# Patient Record
Sex: Female | Born: 1950 | Race: Black or African American | Hispanic: No | Marital: Married | Smoking: Never smoker
Health system: Southern US, Community
[De-identification: ages and names within clinical notes are randomized; demographics above are authoritative.]

## PROBLEM LIST (undated history)

## (undated) DIAGNOSIS — M1990 Unspecified osteoarthritis, unspecified site: Secondary | ICD-10-CM

## (undated) DIAGNOSIS — M549 Dorsalgia, unspecified: Secondary | ICD-10-CM

## (undated) DIAGNOSIS — I10 Essential (primary) hypertension: Secondary | ICD-10-CM

## (undated) DIAGNOSIS — I8390 Asymptomatic varicose veins of unspecified lower extremity: Secondary | ICD-10-CM

## (undated) HISTORY — DX: Asymptomatic varicose veins of unspecified lower extremity: I83.90

## (undated) HISTORY — DX: Unspecified osteoarthritis, unspecified site: M19.90

## (undated) HISTORY — DX: Essential (primary) hypertension: I10

---

## 2012-08-30 HISTORY — PX: BREAST REDUCTION SURGERY: SHX8

## 2012-10-21 DIAGNOSIS — M25549 Pain in joints of unspecified hand: Secondary | ICD-10-CM | POA: Insufficient documentation

## 2013-12-19 DIAGNOSIS — R232 Flushing: Secondary | ICD-10-CM | POA: Insufficient documentation

## 2013-12-19 DIAGNOSIS — M545 Low back pain, unspecified: Secondary | ICD-10-CM | POA: Insufficient documentation

## 2013-12-19 DIAGNOSIS — I10 Essential (primary) hypertension: Secondary | ICD-10-CM | POA: Insufficient documentation

## 2014-03-05 DIAGNOSIS — I8393 Asymptomatic varicose veins of bilateral lower extremities: Secondary | ICD-10-CM | POA: Insufficient documentation

## 2014-03-20 ENCOUNTER — Encounter: Payer: Self-pay | Admitting: Surgery

## 2014-03-20 ENCOUNTER — Other Ambulatory Visit: Payer: Self-pay

## 2014-03-20 DIAGNOSIS — I83813 Varicose veins of bilateral lower extremities with pain: Secondary | ICD-10-CM

## 2014-04-26 ENCOUNTER — Encounter: Payer: Self-pay | Admitting: Surgery

## 2014-04-29 ENCOUNTER — Ambulatory Visit (HOSPITAL_COMMUNITY)
Admission: RE | Admit: 2014-04-29 | Discharge: 2014-04-29 | Disposition: A | Discharge status: Home / Self Care | Payer: BC Managed Care – PPO | Source: Ambulatory Visit | Attending: Surgery | Admitting: Surgery

## 2014-04-29 ENCOUNTER — Ambulatory Visit (INDEPENDENT_AMBULATORY_CARE_PROVIDER_SITE_OTHER): Payer: BC Managed Care – PPO | Admitting: Surgery

## 2014-04-29 ENCOUNTER — Encounter: Payer: Self-pay | Admitting: Surgery

## 2014-04-29 VITALS — BP 127/74 | HR 52 | Ht 65.0 in | Wt 234.0 lb

## 2014-04-29 DIAGNOSIS — I83813 Varicose veins of bilateral lower extremities with pain: Secondary | ICD-10-CM

## 2014-04-29 DIAGNOSIS — I872 Venous insufficiency (chronic) (peripheral): Secondary | ICD-10-CM

## 2014-04-29 NOTE — Progress Notes (Signed)
Patient name: Stephanie Stephenson MRN: [id number] DOB: 12/29/1950 Sex: female   Referred by: Dr Riley NearingAguiar  Reason for referral:  Chief Complaint  Patient presents with  . Varicose Veins    bilateral vv's w/ pain X 5 years    HISTORY OF PRESENT ILLNESS: This is a very pleasant 63 year old female who comes in today for leg pain.  She states that she is been having aching in her legs for a long time but has gotten worse.  Her right leg bothers her more so than the left.  She gets minimal relief from leg elevation.  But benefits to her the most is laying down for a while.  She does endorse swelling in both legs particularly at the end of the day.  She has not had any bleeding episodes.  She is concerned about several areas on her right leg where the veins are protruding.  She denies a history of DVT.  The patient is medically managed for hypertension.  She does take Neurontin for some back pain.  Past Medical History  Diagnosis Date  . Hypertension     Past Surgical History  Procedure Laterality Date  . Breast reduction surgery  08/30/2012    History   Social History  . Marital Status: Married    Spouse Name: N/A    Number of Children: N/A  . Years of Education: N/A   Occupational History  . Not on file.   Social History Main Topics  . Smoking status: Never Smoker   . Smokeless tobacco: Never Used  . Alcohol Use: No  . Drug Use: No  . Sexual Activity: Not on file   Other Topics Concern  . Not on file   Social History Narrative    Family History  Problem Relation Age of Onset  . Hypertension Mother   . Varicose Veins Mother   . Hypertension Father     Allergies as of 04/29/2014  . (Not on File)    Current Outpatient Prescriptions on File Prior to Visit  Medication Sig Dispense Refill  . amLODipine (NORVASC) 5 MG tablet Take 5 mg by mouth daily.    Marland Kitchen. gabapentin (NEURONTIN) 300 MG capsule Take 300 mg by mouth 3 (three) times daily.    .  valsartan-hydrochlorothiazide (DIOVAN-HCT) 320-12.5 MG per tablet Take 1 tablet by mouth daily.     No current facility-administered medications on file prior to visit.     REVIEW OF SYSTEMS: Please see history of present illness, otherwise all systems negative  PHYSICAL EXAMINATION: General: The patient appears their stated age.  Vital signs are BP 127/74 mmHg  Pulse 52  Ht 5\' 5"  (1.651 m)  Wt 234 lb (106.142 kg)  BMI 38.94 kg/m2  SpO2 100% HEENT:  No gross abnormalities Pulmonary: Respirations are non-labored  Musculoskeletal: There are no major deformities.   Neurologic: No focal weakness or paresthesias are detected, Skin: There are no ulcer or rashes noted. Psychiatric: The patient has normal affect. Cardiovascular: There is a regular rate and rhythm without significant murmur appreciated.  Palpable dorsalis pedis pulse bilaterally.  Cluster of reticular veins in the right thigh.  Positive edema bilaterally  Diagnostic Studies: Duplex venous ultrasound has been ordered and reviewed.  The patient has deep vein reflux bilaterally.  She has reflux in the right great saphenous vein with maximum vein diameter of 0.79 cm at the saphenofemoral junction.  She also has reflux and a branch of the left saphenous vein as well as the  proximal left  great saphenous vein    Assessment:  Bilateral venous insufficiency Plan: I discussed with the patient that she has both deep and superficial venous insufficiency bilaterally which is likely the etiology of her leg complaints of aching and swelling.  I discussed with the first step in management is going to be for her to wear 20-30 mmhg thigh-high compression stockings to see how much benefit she gets from this.  She would potentially be a candidate for laser ablation of the right great saphenous vein as well as treatment of the protruding venous anomaly in her right thigh.  I have her scheduled to come back to see Dr. early in 3 months to discuss  her treatment options.     Jorge NyV. Wells Miho Monda IV, M.D. [facility] Office: [phone] Pager:  [phone]

## 2014-07-20 ENCOUNTER — Emergency Department (HOSPITAL_BASED_OUTPATIENT_CLINIC_OR_DEPARTMENT_OTHER)
Admission: EM | Admit: 2014-07-20 | Discharge: 2014-07-20 | Disposition: A | Discharge status: Home / Self Care | Payer: BC Managed Care – PPO | Attending: Emergency Medicine | Admitting: Emergency Medicine

## 2014-07-20 ENCOUNTER — Emergency Department (HOSPITAL_BASED_OUTPATIENT_CLINIC_OR_DEPARTMENT_OTHER): Payer: BC Managed Care – PPO

## 2014-07-20 ENCOUNTER — Encounter (HOSPITAL_BASED_OUTPATIENT_CLINIC_OR_DEPARTMENT_OTHER): Payer: Self-pay

## 2014-07-20 DIAGNOSIS — I10 Essential (primary) hypertension: Secondary | ICD-10-CM | POA: Insufficient documentation

## 2014-07-20 DIAGNOSIS — M25559 Pain in unspecified hip: Secondary | ICD-10-CM

## 2014-07-20 DIAGNOSIS — Z79899 Other long term (current) drug therapy: Secondary | ICD-10-CM | POA: Insufficient documentation

## 2014-07-20 DIAGNOSIS — Y9389 Activity, other specified: Secondary | ICD-10-CM | POA: Diagnosis not present

## 2014-07-20 DIAGNOSIS — S79912A Unspecified injury of left hip, initial encounter: Secondary | ICD-10-CM | POA: Diagnosis present

## 2014-07-20 DIAGNOSIS — X58XXXA Exposure to other specified factors, initial encounter: Secondary | ICD-10-CM | POA: Insufficient documentation

## 2014-07-20 DIAGNOSIS — Y998 Other external cause status: Secondary | ICD-10-CM | POA: Diagnosis not present

## 2014-07-20 DIAGNOSIS — Y9289 Other specified places as the place of occurrence of the external cause: Secondary | ICD-10-CM | POA: Insufficient documentation

## 2014-07-20 HISTORY — DX: Dorsalgia, unspecified: M54.9

## 2014-07-20 MED ORDER — NAPROXEN 500 MG PO TABS
500.0000 mg | ORAL_TABLET | Freq: Two times a day (BID) | ORAL | Status: DC
Start: 2014-07-20 — End: 2022-05-01

## 2014-07-20 NOTE — ED Provider Notes (Signed)
CSN: [id number]     Arrival date & time 07/20/14  1521 History  This chart was scribed for No att. providers found by [facility] Abu Hashem, ED Scribe. The patient was seen in MHT13/MHT13 and the patient's care was started at 5:14 PM.  Chief Complaint  Patient presents with  . Hip Pain   The history is provided by the patient. No language interpreter was used.    HPI Comments: Carroll SageBrenda Stephenson is a 64 y.o. female who presents to the Emergency Department complaining of a new left sided hip pain, acute onset 4 days ago. Pt was at the gym doing back kicks when she suddenly hear a pop in her left hip. Pt states certain movements and sitting positions causes pain. She must drag her foot when walking because "stepping wrong" aggravates her pain. Directly sitting on her left side worsens the pain. Pt states she rubbed the area this morning and noticed the area was very firm. She has taken ibuprofen for no relief. She had intermittent numbness but this has a subsided. She denies  back pain, and urine/bowel incontinence.   Past Medical History  Diagnosis Date  . Hypertension   . Back pain    Past Surgical History  Procedure Laterality Date  . Breast reduction surgery  08/30/2012   Family History  Problem Relation Age of Onset  . Hypertension Mother   . Varicose Veins Mother   . Hypertension Father    History  Substance Use Topics  . Smoking status: Never Smoker   . Smokeless tobacco: Never Used  . Alcohol Use: No   OB History    No data available     Review of Systems  Constitutional: Negative for fever.  Gastrointestinal: Negative for nausea and vomiting.  Musculoskeletal: Positive for myalgias and arthralgias. Negative for back pain, joint swelling and neck pain.  Skin: Negative for wound.  Neurological: Negative for weakness, numbness and headaches.    Allergies  Review of patient's allergies indicates no known allergies.  Home Medications   Prior to Admission medications    Medication Sig Start Date End Date Taking? Authorizing Provider  amLODipine (NORVASC) 5 MG tablet Take 5 mg by mouth daily.    Historical Provider, MD  gabapentin (NEURONTIN) 300 MG capsule Take 300 mg by mouth 3 (three) times daily.    Historical Provider, MD  naproxen (NAPROSYN) 500 MG tablet Take 1 tablet (500 mg total) by mouth 2 (two) times daily. 07/20/14   Rolan BuccoMelanie Nguyen Butler, MD  valsartan-hydrochlorothiazide (DIOVAN-HCT) 320-12.5 MG per tablet Take 1 tablet by mouth daily.    Historical Provider, MD   BP 105/64 mmHg  Pulse 65  Temp(Src) 98.5 F (36.9 C) (Oral)  Resp 18  Ht 5\' 4"  (1.626 m)  Wt 233 lb (105.688 kg)  BMI 39.97 kg/m2  SpO2 97% Physical Exam  Constitutional: She is oriented to person, place, and time. She appears well-developed and well-nourished.  HENT:  Head: Normocephalic and atraumatic.  Neck: Normal range of motion. Neck supple.  Cardiovascular: Normal rate.   Pulmonary/Chest: Effort normal.  Musculoskeletal: She exhibits tenderness. She exhibits no edema.  Patient has tenderness to the posterior aspect of the hip at the area around the hamstring insertion point. There is no significant bony tenderness. There is no pain on range of motion the hip. There is no knee pain. She has normal sensation and motor function in the leg. Pedal pulses are intact. There is no spinal tenderness.  Neurological: She is alert and oriented  to person, place, and time.  Skin: Skin is warm and dry.  Psychiatric: She has a normal mood and affect.    ED Course  Procedures  DIAGNOSTIC STUDIES: Oxygen Saturation is 97% on room air, normal by my interpretation.    COORDINATION OF CARE: 5:19 PM Discussed treatment plan with pt at bedside and pt agreed to plan.  No results found for this or any previous visit. Dg Hip Unilat With Pelvis 2-3 Views Left  07/20/2014   CLINICAL DATA:  Left-sided hip pain, acute onset. Felt pop in left hip. Initial encounter.  EXAM: LEFT HIP (WITH PELVIS) 2-3  VIEWS  COMPARISON:  None.  FINDINGS: There is no evidence of fracture or dislocation. Both femoral heads are seated normally within their respective acetabula. The proximal left femur appears intact. No significant degenerative change is appreciated. The sacroiliac joints are unremarkable in appearance.  The visualized bowel gas pattern is grossly unremarkable in appearance. Scattered phleboliths are noted within the pelvis.  IMPRESSION: No evidence of fracture or dislocation. The patient's presentation raises suspicion for internal derangement at the left hip. MRI of the left hip would be helpful for further evaluation, when and as deemed clinically appropriate.   Electronically Signed   By: Roanna Raider M.D.   On: 07/20/2014 18:38     MDM   Final diagnoses:  Hip pain   Patient has pain mostly at the area of the hamstring insertion point.  This is likely the etiology of her pain. There is no evidence of bony injury on her x-ray. She was given a prescription for Naprosyn to use for pain and inflammation. She didn't want any stronger pain medication. She'll follow-up with Dr. Pearletha Forge for reexam if her symptoms are not improving within the next few days.  I personally performed the services described in this documentation, which was scribed in my presence.  The recorded information has been reviewed and considered.      Rolan Bucco, MD 07/21/14 0010

## 2014-07-20 NOTE — ED Notes (Signed)
Pt reports was exercising and doing a back kick with L leg when heard a "pop" in L hip.  Since this time with pain in same and intermittent numbness to L foot.  Denies loss of bowel/bladder.  Ambualtory wihtout difficulty.

## 2014-07-29 ENCOUNTER — Encounter: Payer: Self-pay | Admitting: Vascular Surgery

## 2014-07-30 ENCOUNTER — Encounter: Payer: Self-pay | Admitting: Vascular Surgery

## 2014-07-30 ENCOUNTER — Ambulatory Visit (INDEPENDENT_AMBULATORY_CARE_PROVIDER_SITE_OTHER): Payer: BC Managed Care – PPO | Admitting: Vascular Surgery

## 2014-07-30 VITALS — BP 120/80 | HR 56 | Resp 18 | Ht 63.0 in | Wt 235.2 lb

## 2014-07-30 DIAGNOSIS — I83893 Varicose veins of bilateral lower extremities with other complications: Secondary | ICD-10-CM | POA: Diagnosis not present

## 2014-07-30 DIAGNOSIS — I83899 Varicose veins of unspecified lower extremities with other complications: Secondary | ICD-10-CM | POA: Insufficient documentation

## 2014-07-30 NOTE — Progress Notes (Signed)
Problems with Activities of Daily Living Secondary to Leg Pain  1. [name] states that all activities that require prolonged standing (cooking, cleaning, shopping) are difficult for her due to leg pain.    2. [name] states that she has difficulty falling asleep due to leg pain.   3. [name] states that traveling in automobile(especialy on trips) is difficult for her due to leg pain.   Failure of  Conservative Therapy:  1. Worn 20-30 mm Hg thigh high compression hose >3 months with no relief of symptoms.  2. Frequently elevates legs-no relief of symptoms  3. Taken Ibuprofen 600 Mg TID with no relief of symptoms.  The patient presents today for continued follow-up of her venous hypertension. This is much worse on her right leg and her left leg. She has been extremely compliant with pain at the style graduated compression garments. She reports these actually made the leg discomfort worse at sometimes an extremely difficult for her to get these on and off. She is obese. 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 Had episode of bleeding from the superficial telangiectasia over her anterior medial calf in the past as well.  Worsened she's had no history of DVT. She reports achy sensation specifically over the varicosities and in her leg with prolonged standing.  Physical exam she does have palpable dorsalis pedis pulses bilaterally. She does have a very pronounced mass of telangiectasia and varicosities that are raised above the skin on the anterior thigh and anterior medial calf. I reimage these areas with SonoSite ultrasound. She does have an anterior accessory branch of her great saphenous vein that his feeding directly into this large plexus and also into the varicosities extend from this down across the anterior surface of her knee distally. She also has enlargement and reflux in her  great saphenous vein throughout its course with some communication into the plexus in her medial calf.  Impression and plan: Failed conservative treatment of severe venous hypertension related to anterior accessory branch and great saphenous vein reflux. I have recommended treatment. I have recommended ablation of her anterior accessory branch and stab phlebectomy of these large tributaries arising from it. Would also recommend sclerotherapy of these large superficial telangiectasia as well to reduce her risk for rebleed. There is a staged procedure I would recommend ablation of her right great saphenous vein. She understands this as a outpatient procedure under local anesthesia. We will coordinate this at her earliest convenience.

## 2014-08-08 ENCOUNTER — Other Ambulatory Visit: Payer: Self-pay | Admitting: *Deleted

## 2014-08-08 DIAGNOSIS — I83893 Varicose veins of bilateral lower extremities with other complications: Secondary | ICD-10-CM

## 2014-08-12 ENCOUNTER — Telehealth: Payer: Self-pay | Admitting: Vascular Surgery

## 2014-08-12 NOTE — Telephone Encounter (Signed)
-----   Message from Micah FlesherSonya D Rankin, RN sent at 08/08/2014  4:57 PM EDT ----- Regarding: scheduling Please schedule Stephanie Stephenson for:   1.  09-03-2014 Post LA duplex (right accessory branch of GSV, right leg, order in EPIC)  and VV FU with Dr. Arbie CookeyEarly.   2.  10-10-2014  Post LA duplex (right greater saphenous vein, order in EPIC) and VV FU with Dr. Arbie CookeyEarly.    Thanks!

## 2014-08-28 ENCOUNTER — Encounter: Payer: Self-pay | Admitting: Vascular Surgery

## 2014-08-29 ENCOUNTER — Ambulatory Visit (INDEPENDENT_AMBULATORY_CARE_PROVIDER_SITE_OTHER): Payer: BC Managed Care – PPO | Admitting: Vascular Surgery

## 2014-08-29 ENCOUNTER — Encounter: Payer: Self-pay | Admitting: Vascular Surgery

## 2014-08-29 VITALS — BP 153/93 | HR 54 | Resp 16 | Ht 63.0 in | Wt 235.0 lb

## 2014-08-29 DIAGNOSIS — I83893 Varicose veins of bilateral lower extremities with other complications: Secondary | ICD-10-CM | POA: Diagnosis not present

## 2014-08-29 HISTORY — PX: ENDOVENOUS ABLATION SAPHENOUS VEIN W/ LASER: SUR449

## 2014-08-29 NOTE — Progress Notes (Signed)
     Laser Ablation Procedure    Date: 08/29/2014   Carroll SageBrenda Cleckler DOB:09/03/1950  Consent signed: Yes    Surgeon:  Dr. Tawanna Coolerodd Irean Kendricks  Procedure: Laser Ablation: right ant accessory saphenous Vein  BP 153/93 mmHg  Pulse 54  Resp 16  Ht 5\' 3"  (1.6 m)  Wt 235 lb (106.595 kg)  BMI 41.64 kg/m2  Tumescent Anesthesia: 425 cc 0.9% NaCl with 50 cc Lidocaine HCL with 1% Epi and 15 cc 8.4% NaHCO3  Local Anesthesia: 6 cc Lidocaine HCL and NaHCO3 (ratio 2:1)  15 watts continuous mode        Total energy: 1324   Total time: 1:28    Stab Phlebectomy: 10-20 Sites: Thigh and Calf  Patient tolerated procedure well  Notes:   Description of Procedure:  After marking the course of the secondary varicosities, the patient was placed on the operating table in the supine position, and the right leg was prepped and draped in sterile fashion.   Local anesthetic was administered and under ultrasound guidance the saphenous vein was accessed with a micro needle and guide wire; then the mirco puncture sheath was placed.  A guide wire was inserted saphenofemoral junction , followed by a 5 french sheath.  The position of the sheath and then the laser fiber below the junction was confirmed using the ultrasound.  Tumescent anesthesia was administered along the course of the saphenous vein using ultrasound guidance. The patient was placed in Trendelenburg position and protective laser glasses were placed on patient and staff, and the laser was fired at 15 watts continuous mode advancing 1-612mm/second for a total of 1324 joules.   For stab phlebectomies, local anesthetic was administered at the previously marked varicosities, and tumescent anesthesia was administered around the vessels.  Ten to 20 stab wounds were made using the tip of an 11 blade. And using the vein hook, the phlebectomies were performed using a hemostat to avulse the varicosities.  Adequate hemostasis was achieved.     Steri strips were applied to  the stab wounds and ABD pads and thigh high compression stockings were applied.  Ace wrap bandages were applied over the phlebectomy sites and at the top of the saphenofemoral junction. Blood loss was less than 15 cc.  The patient ambulated out of the operating room having tolerated the procedure well.

## 2014-09-02 ENCOUNTER — Encounter: Payer: Self-pay | Admitting: Vascular Surgery

## 2014-09-03 ENCOUNTER — Ambulatory Visit (HOSPITAL_COMMUNITY)
Admission: RE | Admit: 2014-09-03 | Discharge: 2014-09-03 | Disposition: A | Discharge status: Home / Self Care | Payer: BC Managed Care – PPO | Source: Ambulatory Visit | Attending: Vascular Surgery | Admitting: Vascular Surgery

## 2014-09-03 ENCOUNTER — Encounter: Payer: Self-pay | Admitting: Vascular Surgery

## 2014-09-03 ENCOUNTER — Ambulatory Visit (INDEPENDENT_AMBULATORY_CARE_PROVIDER_SITE_OTHER): Payer: Self-pay | Admitting: Vascular Surgery

## 2014-09-03 ENCOUNTER — Other Ambulatory Visit: Payer: Self-pay | Admitting: Vascular Surgery

## 2014-09-03 VITALS — BP 136/84 | HR 86 | Resp 18 | Ht 63.0 in | Wt 235.0 lb

## 2014-09-03 DIAGNOSIS — I83891 Varicose veins of right lower extremities with other complications: Secondary | ICD-10-CM | POA: Diagnosis not present

## 2014-09-03 DIAGNOSIS — I83893 Varicose veins of bilateral lower extremities with other complications: Secondary | ICD-10-CM

## 2014-09-03 DIAGNOSIS — I83899 Varicose veins of unspecified lower extremities with other complications: Secondary | ICD-10-CM | POA: Insufficient documentation

## 2014-09-03 NOTE — Progress Notes (Signed)
Patient is here today for follow-up of laser ablation of right anterior accessory branch and stab phlebectomy of multiple large surgery varicosities on 08/29/2014. She had amount of bruising. She has done well otherwise and said the minimal discomfort associated with this.  Duplex today shows successful closure of her anterior accessory branch evidence of DVT.  Impression and plan successful treatment of her anterior accessory branch which was the main factor in feeding these very large varicosities which were removed with stab phlebectomy. She will be seen again on 10/03/2014 for ablation of great saphenous vein which is also enlarged and has reflux. She may require sclerotherapy of telangiectasia depending on resolution after treatment of her saphenous vein.

## 2014-09-05 ENCOUNTER — Ambulatory Visit: Payer: BC Managed Care – PPO | Admitting: Vascular Surgery

## 2014-09-05 ENCOUNTER — Encounter (HOSPITAL_COMMUNITY): Payer: BC Managed Care – PPO

## 2014-10-01 ENCOUNTER — Encounter: Payer: Self-pay | Admitting: Vascular Surgery

## 2014-10-03 ENCOUNTER — Ambulatory Visit (INDEPENDENT_AMBULATORY_CARE_PROVIDER_SITE_OTHER): Payer: BC Managed Care – PPO | Admitting: Vascular Surgery

## 2014-10-03 ENCOUNTER — Encounter: Payer: Self-pay | Admitting: Vascular Surgery

## 2014-10-03 VITALS — BP 125/81 | HR 59 | Resp 18 | Ht 65.0 in | Wt 234.0 lb

## 2014-10-03 DIAGNOSIS — I83891 Varicose veins of right lower extremities with other complications: Secondary | ICD-10-CM | POA: Diagnosis not present

## 2014-10-03 HISTORY — PX: ENDOVENOUS ABLATION SAPHENOUS VEIN W/ LASER: SUR449

## 2014-10-03 NOTE — Progress Notes (Signed)
     Laser Ablation Procedure    Date: 10/03/2014   Stephanie Stephenson DOB:08/10/1950  Consent signed: Yes    Surgeon:  Dr. Tawanna Coolerodd Jaysie Benthall  Procedure: Laser Ablation: right Greater Saphenous Vein  BP 125/81 mmHg  Pulse 59  Resp 18  Ht 5\' 5"  (1.651 m)  Wt 234 lb (106.142 kg)  BMI 38.94 kg/m2  Tumescent Anesthesia: 475 cc 0.9% NaCl with 50 cc Lidocaine HCL with 1% Epi and 15 cc 8.4% NaHCO3  Local Anesthesia: 3 cc Lidocaine HCL and NaHCO3 (ratio 2:1)  15 watts continuous mode        Total energy: 1790 Joules   Total time: 1:59      Patient tolerated procedure well    Description of Procedure:  After marking the course of the secondary varicosities, the patient was placed on the operating table in the supine position, and the right leg was prepped and draped in sterile fashion.   Local anesthetic was administered and under ultrasound guidance the saphenous vein was accessed with a micro needle and guide wire; then the mirco puncture sheath was placed.  A guide wire was inserted saphenofemoral junction , followed by a 5 french sheath.  The position of the sheath and then the laser fiber below the junction was confirmed using the ultrasound.  Tumescent anesthesia was administered along the course of the saphenous vein using ultrasound guidance. The patient was placed in Trendelenburg position and protective laser glasses were placed on patient and staff, and the laser was fired at 15 watts continuous mode advancing 1-82mm/second for a total of 1790 joules.        Steri strips were applied and ABD pads and thigh high compression stockings were applied.  Ace wrap bandages were applied  at the top of the saphenofemoral junction. Blood loss was less than 15 cc.  The patient ambulated out of the operating room having tolerated the procedure well.   uneventful laser ablation from just above the knee to just below the saphenofemoral junction.

## 2014-10-04 ENCOUNTER — Telehealth: Payer: Self-pay | Admitting: *Deleted

## 2014-10-04 NOTE — Telephone Encounter (Signed)
    10/04/2014  Time: 10:13 AM   Patient Name: Stephanie Stephenson  Patient of: T.F. Early  Procedure:Laser Ablation right greater saphenous vein 10-03-2014  Reached patient at home and checked  Her status  Yes    Comments/Actions Taken: [name] states that yesterday she removed her thigh high compression dressing because it was very tight and she replaced it with her pantyhose compression stockings.  Instructed her to place ace wrap on her right thigh up to the right groin.  Reviewed post procedural instructions with her and reminded her of post LA duplex and VV FU with Dr. Arbie CookeyEarly on 10-10-2014.       @SIGNATURE @

## 2014-10-09 ENCOUNTER — Encounter: Payer: Self-pay | Admitting: Vascular Surgery

## 2014-10-10 ENCOUNTER — Ambulatory Visit (INDEPENDENT_AMBULATORY_CARE_PROVIDER_SITE_OTHER): Payer: Self-pay | Admitting: Vascular Surgery

## 2014-10-10 ENCOUNTER — Encounter: Payer: Self-pay | Admitting: Vascular Surgery

## 2014-10-10 ENCOUNTER — Ambulatory Visit (HOSPITAL_COMMUNITY)
Admission: RE | Admit: 2014-10-10 | Discharge: 2014-10-10 | Disposition: A | Discharge status: Home / Self Care | Payer: BC Managed Care – PPO | Source: Ambulatory Visit | Attending: Vascular Surgery | Admitting: Vascular Surgery

## 2014-10-10 VITALS — BP 134/70 | HR 66 | Resp 16 | Ht 65.0 in | Wt 236.0 lb

## 2014-10-10 DIAGNOSIS — I83891 Varicose veins of right lower extremities with other complications: Secondary | ICD-10-CM

## 2014-10-10 DIAGNOSIS — I83893 Varicose veins of bilateral lower extremities with other complications: Secondary | ICD-10-CM | POA: Diagnosis not present

## 2014-10-10 NOTE — Progress Notes (Signed)
The patient presents today for follow-up of her ablation of right great saphenous vein she had ablation of anterior accessory branch and stab phlebectomy of large painful tributary varicosities several weeks earlier. She did quite well with the ablation last week minimal discomfort associated with this.  On physical exam she has minimal bruising. No tenderness and no skin irritation.  Duplex today reveals closure of her great saphenous vein from the distal insertion site of her knee to 1 cm below the saphenofemoral junction with no DVT  Impression and plan: Successful treatment of venous hypertension right leg area she will continue her compression garments one additional week and then as needed. Will see us again on as-needed basis

## 2014-10-28 ENCOUNTER — Encounter: Payer: Self-pay | Admitting: *Deleted

## 2014-10-30 ENCOUNTER — Ambulatory Visit (INDEPENDENT_AMBULATORY_CARE_PROVIDER_SITE_OTHER): Payer: BC Managed Care – PPO | Admitting: *Deleted

## 2014-10-30 DIAGNOSIS — I83899 Varicose veins of unspecified lower extremities with other complications: Secondary | ICD-10-CM | POA: Diagnosis not present

## 2014-10-30 DIAGNOSIS — I83891 Varicose veins of right lower extremities with other complications: Secondary | ICD-10-CM

## 2014-10-30 NOTE — Progress Notes (Signed)
X=.3% Sotradecol administered with a 27g butterfly.  Patient received a total of 18cc.  Treated all her spider veins that have a potential for hemorrhage. Easy access. Tol well. She has new varicose veins that have developed on her L thigh. Alerted Domenic Moras RN who will discuss with Dr. Arbie Cookey tomorrow to make a plan. She has continued to wear her 20-30 compression stockings since Dec. 2015   Photos: No.  Compression stockings applied: Yes.

## 2014-10-31 ENCOUNTER — Other Ambulatory Visit: Payer: Self-pay | Admitting: *Deleted

## 2014-10-31 ENCOUNTER — Telehealth: Payer: Self-pay | Admitting: Vascular Surgery

## 2014-10-31 DIAGNOSIS — I83812 Varicose veins of left lower extremities with pain: Secondary | ICD-10-CM

## 2014-10-31 NOTE — Telephone Encounter (Signed)
-----   Message from Micah Flesher, RN sent at 10/31/2014  1:16 PM EDT ----- Regarding: scheduling Per Dr. Bosie Helper instruction, please schedule (and call her ) Stephanie Stephenson for venous reflux study of left leg and VV FU with Dr. Arbie Cookey at her convenience.  Will put order in EPIC for reflux study.  She is experiencing large, painful VV in her left thigh.

## 2014-10-31 NOTE — Telephone Encounter (Signed)
Spoke with pt to schedule, dpm °

## 2014-11-01 ENCOUNTER — Encounter: Payer: Self-pay | Admitting: Vascular Surgery

## 2014-11-05 ENCOUNTER — Ambulatory Visit (INDEPENDENT_AMBULATORY_CARE_PROVIDER_SITE_OTHER): Payer: BC Managed Care – PPO | Admitting: Vascular Surgery

## 2014-11-05 ENCOUNTER — Ambulatory Visit (HOSPITAL_COMMUNITY)
Admission: RE | Admit: 2014-11-05 | Discharge: 2014-11-05 | Disposition: A | Discharge status: Home / Self Care | Payer: BC Managed Care – PPO | Source: Ambulatory Visit | Attending: Vascular Surgery | Admitting: Vascular Surgery

## 2014-11-05 ENCOUNTER — Encounter: Payer: Self-pay | Admitting: Vascular Surgery

## 2014-11-05 VITALS — BP 128/83 | HR 53 | Resp 18 | Ht 65.0 in | Wt 232.2 lb

## 2014-11-05 DIAGNOSIS — I83893 Varicose veins of bilateral lower extremities with other complications: Secondary | ICD-10-CM | POA: Diagnosis not present

## 2014-11-05 DIAGNOSIS — I83812 Varicose veins of left lower extremities with pain: Secondary | ICD-10-CM | POA: Diagnosis present

## 2014-11-05 NOTE — Progress Notes (Signed)
Problems with Activities of Daily Living Secondary to Leg Pain  1. Stephanie Stephenson states that she has difficulty doing yard work due to leg pain.   2. Stephanie Stephenson states she has stopped exercising due to leg pain.    3. Stephanie Stephenson states she has difficulty with any activities that require prolonged standing (cooking, cleaning, shopping) due to leg pain.   4.  Stephanie Stephenson has limited her driving (automobile) frequency due to leg pain.     Failure of  Conservative Therapy:  1. Worn 20-30 mm Hg thigh high compression hose >3 months with no relief of symptoms.  2. Frequently elevates legs-no relief of symptoms  3. Taken Ibuprofen 600 Mg TID with no relief of symptoms.  Here today for discussion of left leg venous hypertension. She is quite pleased with her result from treatment of her right leg. She had ablation of right saphenous vein and the phlebectomy of multiple large painful tributary varicosities. She has had progression of varicosities and pain in her left leg. These are in the medial thigh extending onto the anterior surface of her thigh. She reports these are quite uncomfortable with prolonged standing. She has been very diligent with Stephanie Stephenson compression and this is not helped.  On physical exam obese black female in no acute distress Her 2+ dorsalis pedis pulses bilaterally Right leg has very nice continued resolution after stab phlebectomy of large tributary varicosities on her anterior thigh. Left leg with large varices in her medial and anterior thigh  She did undergo formal venous duplex shows enlargement of her great saphenous vein throughout its course. It ranges from 8 mm at the knee to 1.1 cm at the saphenofemoral junction. There is reflux present. She does not have any evidence of deep venous reflux on the left  Impression and plan. Symptomatic venous hypertension left leg have recommended laser ablation of her left great saphenous vein and phlebectomy of her large  tributary varicosities arising from this. She wished to proceed as soon as possible

## 2014-11-07 ENCOUNTER — Other Ambulatory Visit: Payer: Self-pay | Admitting: *Deleted

## 2014-11-07 DIAGNOSIS — I83893 Varicose veins of bilateral lower extremities with other complications: Secondary | ICD-10-CM

## 2014-12-05 DIAGNOSIS — R202 Paresthesia of skin: Secondary | ICD-10-CM | POA: Insufficient documentation

## 2015-01-01 ENCOUNTER — Encounter: Payer: Self-pay | Admitting: Vascular Surgery

## 2015-01-02 ENCOUNTER — Encounter: Payer: Self-pay | Admitting: Vascular Surgery

## 2015-01-02 ENCOUNTER — Ambulatory Visit (INDEPENDENT_AMBULATORY_CARE_PROVIDER_SITE_OTHER): Payer: BC Managed Care – PPO | Admitting: Vascular Surgery

## 2015-01-02 DIAGNOSIS — I83892 Varicose veins of left lower extremities with other complications: Secondary | ICD-10-CM

## 2015-01-02 HISTORY — PX: ENDOVENOUS ABLATION SAPHENOUS VEIN W/ LASER: SUR449

## 2015-01-02 NOTE — Progress Notes (Signed)
     Laser Ablation Procedure    Date: 01/02/2015   Stephanie Stephenson DOB:May 30, 1950  Consent signed: Yes    Surgeon:  Dr. Tawanna Cooler Dhruvan Gullion  Procedure: Laser Ablation: left Greater Saphenous Vein  BP 134/86 mmHg  Pulse 84  Temp(Src) 97.1 F (36.2 C) (Oral)  Resp 18  Ht  (1.651 m)  Wt 236 lb (107.049 kg)  BMI 39.27 kg/m2  SpO2 99%  Tumescent Anesthesia: 500 cc 0.9% NaCl with 50 cc Lidocaine HCL with 1% Epi and 15 cc 8.4% NaHCO3  Local Anesthesia: 6 cc Lidocaine HCL and NaHCO3 (ratio 2:1)  15 watts continuous mode        Total energy: 2221 Joules   Total time: 2 :27    Stab Phlebectomy: 10-20 Sites: Thigh and Calf  (left leg)  Patient tolerated procedure well    Description of Procedure:  After marking the course of the secondary varicosities, the patient was placed on the operating table in the supine position, and the left leg was prepped and draped in sterile fashion.   Local anesthetic was administered and under ultrasound guidance the saphenous vein was accessed with a micro needle and guide wire; then the mirco puncture sheath was placed.  A guide wire was inserted saphenofemoral junction , followed by a 5 french sheath.  The position of the sheath and then the laser fiber below the junction was confirmed using the ultrasound.  Tumescent anesthesia was administered along the course of the saphenous vein using ultrasound guidance. The patient was placed in Trendelenburg position and protective laser glasses were placed on patient and staff, and the laser was fired at 15 watts continuous mode advancing 1-24mm/second for a total of 2221 joules.   For stab phlebectomies, local anesthetic was administered at the previously marked varicosities, and tumescent anesthesia was administered around the vessels.  Ten to 20 stab wounds were made using the tip of an 11 blade. And using the vein hook, the phlebectomies were performed using a hemostat to avulse the varicosities.  Adequate  hemostasis was achieved.     Steri strips were applied to the stab wounds and ABD pads and thigh high compression stockings were applied.  Ace wrap bandages were applied over the phlebectomy sites and at the top of the saphenofemoral junction. Blood loss was less than 15 cc.  The patient ambulated out of the operating room having tolerated the procedure well.  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

## 2015-01-03 ENCOUNTER — Telehealth: Payer: Self-pay | Admitting: *Deleted

## 2015-01-03 NOTE — Telephone Encounter (Signed)
    01/03/2015  Time: 11:07 AM   Patient Name: Stephanie Stephenson  Patient of: T.F. Early  Procedure:Laser Ablation left greater saphenous vein and stab phlebectomy 10-20 incisions left leg 01-02-2015  Reached patient at home and checked  Her status  Yes    Comments/Actions Taken: [name] states she is having difficulty with Ace Bandages rolling down her thigh and calf. States the pantyhose compression hose are staying in place and that she has placed thigh high compression hose over pantyhose style compression instead of Ace wraps.  Encouraged her to remove thigh high compression hose if she experienced pain or numbness in the left leg or foot.  She states she had "small amount of bleeding/oozing" noted on compression dressing yesterday around stab phlebectomy site but no bleeding today.  No c/o left leg pain or swelling.  Reviewed post procedural instructions with her and reminded her of post LA duplex and VV follow up with Dr. Arbie Cookey on 01-09-2015.         @

## 2015-01-08 ENCOUNTER — Encounter: Payer: Self-pay | Admitting: Vascular Surgery

## 2015-01-09 ENCOUNTER — Ambulatory Visit (INDEPENDENT_AMBULATORY_CARE_PROVIDER_SITE_OTHER): Payer: Self-pay | Admitting: Vascular Surgery

## 2015-01-09 ENCOUNTER — Ambulatory Visit (HOSPITAL_COMMUNITY)
Admission: RE | Admit: 2015-01-09 | Discharge: 2015-01-09 | Disposition: A | Discharge status: Home / Self Care | Payer: BC Managed Care – PPO | Source: Ambulatory Visit | Attending: Vascular Surgery | Admitting: Vascular Surgery

## 2015-01-09 ENCOUNTER — Encounter: Payer: Self-pay | Admitting: Vascular Surgery

## 2015-01-09 VITALS — BP 124/88 | HR 60 | Resp 16 | Ht 62.0 in | Wt 235.0 lb

## 2015-01-09 DIAGNOSIS — I83893 Varicose veins of bilateral lower extremities with other complications: Secondary | ICD-10-CM

## 2015-01-09 DIAGNOSIS — I10 Essential (primary) hypertension: Secondary | ICD-10-CM | POA: Insufficient documentation

## 2015-01-09 NOTE — Progress Notes (Signed)
Here today for follow-up of her left leg great saphenous vein ablation and stab phlebectomy 1 week ago. She had similar treatment to her right leg month ago. She has continued to quite well. She has been compliant with her compression garments. She reports minimal discomfort associated with this.  On physical exam she does have a mild to moderate bruising in her medial left thigh. No evidence of irritation from her phlebectomy sites.  She underwent duplex showing closure of her great saphenous vein from the distal insertion site below the saphenofemoral junction and no evidence of DVT  Impression and plan stable status post staged bilateral laser ablation. We'll continue her compression garment for one additional week. She is interested in potential treatment for telangiectasia and we'll schedule this with Carney Harder

## 2015-03-05 ENCOUNTER — Ambulatory Visit (INDEPENDENT_AMBULATORY_CARE_PROVIDER_SITE_OTHER): Payer: BC Managed Care – PPO | Admitting: *Deleted

## 2015-03-05 DIAGNOSIS — I83892 Varicose veins of left lower extremities with other complications: Secondary | ICD-10-CM

## 2015-03-05 DIAGNOSIS — I83893 Varicose veins of bilateral lower extremities with other complications: Secondary | ICD-10-CM

## 2015-03-05 NOTE — Progress Notes (Signed)
X=.3% Sotradecol administered with a 27g butterfly.  Patient received a total of 6cc.  Treated all I could with one syringe on the left leg. She will come back in Dec for work on the back of her left knee. Easy access. Tol well.  Photos: No.  Compression stockings applied: Yes.

## 2015-03-10 ENCOUNTER — Encounter: Payer: Self-pay | Admitting: Vascular Surgery

## 2015-03-11 DIAGNOSIS — J302 Other seasonal allergic rhinitis: Secondary | ICD-10-CM | POA: Insufficient documentation

## 2015-03-11 DIAGNOSIS — Z0000 Encounter for general adult medical examination without abnormal findings: Secondary | ICD-10-CM | POA: Insufficient documentation

## 2015-04-30 ENCOUNTER — Ambulatory Visit: Payer: BC Managed Care – PPO | Admitting: *Deleted

## 2015-05-02 ENCOUNTER — Encounter: Payer: Self-pay | Admitting: *Deleted

## 2015-05-07 ENCOUNTER — Ambulatory Visit (INDEPENDENT_AMBULATORY_CARE_PROVIDER_SITE_OTHER): Payer: BC Managed Care – PPO | Admitting: *Deleted

## 2015-05-07 DIAGNOSIS — I83893 Varicose veins of bilateral lower extremities with other complications: Secondary | ICD-10-CM

## 2015-05-07 NOTE — Progress Notes (Signed)
X=.3% Sotradecol administered with a 27g butterfly.  Patient received a total of 6cc.  Pt wanted me to treat specific sites on her left leg. Fairly easy access. Tol well. Anticipate improvement. Follow this nice lady prn.  Photos: No.  Compression stockings applied: Yes.

## 2015-05-08 ENCOUNTER — Encounter: Payer: Self-pay | Admitting: Vascular Surgery

## 2015-11-05 DIAGNOSIS — Z23 Encounter for immunization: Secondary | ICD-10-CM | POA: Insufficient documentation

## 2016-05-21 DIAGNOSIS — H539 Unspecified visual disturbance: Secondary | ICD-10-CM | POA: Insufficient documentation

## 2018-02-14 LAB — HM COLONOSCOPY

## 2018-04-01 ENCOUNTER — Emergency Department (HOSPITAL_BASED_OUTPATIENT_CLINIC_OR_DEPARTMENT_OTHER)
Admission: EM | Admit: 2018-04-01 | Discharge: 2018-04-01 | Disposition: A | Discharge status: Home / Self Care | Payer: Medicare Other | Attending: Emergency Medicine | Admitting: Emergency Medicine

## 2018-04-01 ENCOUNTER — Other Ambulatory Visit: Payer: Self-pay

## 2018-04-01 ENCOUNTER — Encounter (HOSPITAL_BASED_OUTPATIENT_CLINIC_OR_DEPARTMENT_OTHER): Payer: Self-pay | Admitting: Emergency Medicine

## 2018-04-01 DIAGNOSIS — M791 Myalgia, unspecified site: Secondary | ICD-10-CM | POA: Insufficient documentation

## 2018-04-01 DIAGNOSIS — M79604 Pain in right leg: Secondary | ICD-10-CM | POA: Diagnosis not present

## 2018-04-01 DIAGNOSIS — Z79899 Other long term (current) drug therapy: Secondary | ICD-10-CM | POA: Diagnosis not present

## 2018-04-01 DIAGNOSIS — M79605 Pain in left leg: Secondary | ICD-10-CM

## 2018-04-01 DIAGNOSIS — I10 Essential (primary) hypertension: Secondary | ICD-10-CM | POA: Insufficient documentation

## 2018-04-01 DIAGNOSIS — R1110 Vomiting, unspecified: Secondary | ICD-10-CM | POA: Insufficient documentation

## 2018-04-01 DIAGNOSIS — R509 Fever, unspecified: Secondary | ICD-10-CM | POA: Insufficient documentation

## 2018-04-01 LAB — URINALYSIS, ROUTINE W REFLEX MICROSCOPIC
Bilirubin Urine: NEGATIVE
Glucose, UA: NEGATIVE mg/dL
Ketones, ur: NEGATIVE mg/dL
Leukocytes, UA: NEGATIVE
Nitrite: NEGATIVE
Protein, ur: NEGATIVE mg/dL
Specific Gravity, Urine: 1.025 (ref 1.005–1.030)
pH: 6 (ref 5.0–8.0)

## 2018-04-01 LAB — COMPREHENSIVE METABOLIC PANEL
ALT: 12 U/L (ref 0–44)
AST: 19 U/L (ref 15–41)
Albumin: 3.6 g/dL (ref 3.5–5.0)
Alkaline Phosphatase: 52 U/L (ref 38–126)
Anion gap: 8 (ref 5–15)
BUN: 13 mg/dL (ref 8–23)
CHLORIDE: 107 mmol/L (ref 98–111)
CO2: 24 mmol/L (ref 22–32)
CREATININE: 0.95 mg/dL (ref 0.44–1.00)
Calcium: 9.1 mg/dL (ref 8.9–10.3)
GFR calc Af Amer: >60 mL/min (ref >60)
GFR calc non Af Amer: >60 mL/min (ref >60)
Glucose, Bld: 109 mg/dL — ABNORMAL HIGH (ref 70–99)
Potassium: 3.6 mmol/L (ref 3.5–5.1)
Sodium: 139 mmol/L (ref 135–145)
Total Bilirubin: 0.4 mg/dL (ref 0.3–1.2)
Total Protein: 6.6 g/dL (ref 6.5–8.1)

## 2018-04-01 LAB — CBC
HEMATOCRIT: 39.7 % (ref 36.0–46.0)
HEMOGLOBIN: 12.6 g/dL (ref 12.0–15.0)
MCH: 27.5 pg (ref 26.0–34.0)
MCHC: 31.7 g/dL (ref 30.0–36.0)
MCV: 86.7 fL (ref 80.0–100.0)
Platelets: 252 10*3/uL (ref 150–400)
RBC: 4.58 MIL/uL (ref 3.87–5.11)
RDW: 13.9 % (ref 11.5–15.5)
WBC: 3.6 10*3/uL — ABNORMAL LOW (ref 4.0–10.5)
nRBC: 0 % (ref 0.0–0.2)

## 2018-04-01 LAB — URINALYSIS, MICROSCOPIC (REFLEX)

## 2018-04-01 LAB — LIPASE, BLOOD: Lipase: 31 U/L (ref 11–51)

## 2018-04-01 MED ORDER — SODIUM CHLORIDE 0.9 % IV BOLUS
500.0000 mL | Freq: Once | INTRAVENOUS | Status: AC
  Administered 2018-04-01: 500 mL via INTRAVENOUS

## 2018-04-01 MED ORDER — KETOROLAC TROMETHAMINE 15 MG/ML IJ SOLN
15.0000 mg | Freq: Once | INTRAMUSCULAR | Status: AC
  Administered 2018-04-01: 15 mg via INTRAVENOUS
  Filled 2018-04-01: qty 1

## 2018-04-01 MED ORDER — ACETAMINOPHEN 325 MG PO TABS
650.0000 mg | ORAL_TABLET | Freq: Once | ORAL | Status: AC
  Administered 2018-04-01: 650 mg via ORAL
  Filled 2018-04-01: qty 2

## 2018-04-01 NOTE — ED Provider Notes (Signed)
[facility] EMERGENCY DEPARTMENT Provider Note   CSN: [id number] Arrival date & time: 04/01/18  0846     History   Chief Complaint Chief Complaint  Patient presents with  . Leg Pain  . Abdominal Pain  . Headache    HPI Stephanie Stephenson is a 67 y.o. female presenting for evaluation of bilateral leg pain, abdominal cramping, and vomiting.  Patient states she started to feel poorly 3 days ago.  She had intermittent abdominal cramping and 2 episodes of vomiting.  Vomiting and abdominal cramping have resolved, but since then she has developed bilateral upper leg pain.  It is posterior.  She describes it as a cramping soreness.  She denies fall, trauma, or injury.  She took an Excedrin without improvement of symptoms.  She has not tried anything else.  Pain is constant, no change with position or activity.  She denies history of back problems or sciatica.  She denies back pain currently.  She denies fevers, chills, chest pain, shortness breath, current nausea or vomiting, current abdominal pain, urinary symptoms, normal bowel movements.  She denies pain in her lower legs.  She does have a history of varicose veins, states this feels different than when she has had issues with that.  A history of high blood pressure for which she takes medication, no other medical problems.  HPI  Past Medical History:  Diagnosis Date  . Back pain   . Hypertension   . Varicose veins     Patient Active Problem List   Diagnosis Date Noted  . Varicose veins of leg with complications 09/03/2014  . Varicose veins of lower extremities with complications 07/30/2014    Past Surgical History:  Procedure Laterality Date  . BREAST REDUCTION SURGERY  08/30/2012  . ENDOVENOUS ABLATION SAPHENOUS VEIN W/ LASER Right 08-29-2014   endovenous laser ablation right anterior branch of GSV, sclerotherapy (right leg), stab phlebectomy 10-20 incisions (right leg) by Gretta Began MD  . ENDOVENOUS ABLATION SAPHENOUS  VEIN W/ LASER Right 10-03-2014   endovenous laser ablation right greater saphenous vein by Gretta Began MD  . ENDOVENOUS ABLATION SAPHENOUS VEIN W/ LASER Left 01-02-2015   endovenous laser ablation left greater saphenous and stab phlebectomy 10-20 incisions left leg      OB History   None      Home Medications    Prior to Admission medications   Medication Sig Start Date End Date Taking? Authorizing Provider  amLODipine (NORVASC) 5 MG tablet Take 5 mg by mouth daily.    [provider]  gabapentin (NEURONTIN) 300 MG capsule Take 300 mg by mouth 3 (three) times daily.    [provider]  naproxen (NAPROSYN) 500 MG tablet Take 1 tablet (500 mg total) by mouth 2 (two) times daily. Patient not taking: Reported on 01/09/2015 07/20/14   Rolan Bucco, MD  valsartan-hydrochlorothiazide (DIOVAN-HCT) 320-12.5 MG per tablet Take 1 tablet by mouth daily.    [provider]    Family History Family History  Problem Relation Age of Onset  . Hypertension Mother   . Varicose Veins Mother   . Hypertension Father     Social History Social History   Tobacco Use  . Smoking status: Never Smoker  . Smokeless tobacco: Never Used  Substance Use Topics  . Alcohol use: No    Alcohol/week: 0.0 standard drinks  . Drug use: No     Allergies   Patient has no known allergies.   Review of Systems Review of Systems  Gastrointestinal: Positive for abdominal pain (Resolved) and vomiting (Resolved).  Musculoskeletal: Positive for myalgias.  All other systems reviewed and are negative.    Physical Exam Updated Vital Signs BP (!) 153/82 (BP Location: Right Arm)   Pulse 70   Temp 100.2 F (37.9 C) (Oral)   Resp 20   Ht 5' (1.524 m)   Wt (!) 214 kg   SpO2 98%   BMI 92.14 kg/m   Physical Exam  Constitutional: She is oriented to person, place, and time. She appears well-developed and well-nourished. No distress.  Sitting comfortably in the bed in no acute distress   HENT:  Head: Normocephalic and atraumatic.  Eyes: Pupils are equal, round, and reactive to light. Conjunctivae and EOM are normal.  Neck: Normal range of motion. Neck supple.  Cardiovascular: Normal rate, regular rhythm and intact distal pulses.  Pulmonary/Chest: Effort normal and breath sounds normal. No respiratory distress. She has no wheezes.  Abdominal: Soft. She exhibits no distension and no mass. There is no tenderness. There is no guarding.  No tenderness palpation of the abdomen.  Soft without rigidity, guarding, distention.  Musculoskeletal: Normal range of motion. She exhibits no edema or tenderness.  No tenderness palpation of the bilateral upper legs.  No erythema, induration, warmth, or swelling.  No tenderness palpation of back or midline spine.  No increased pain with straight leg raise.  Pedal pulses intact bilaterally.  No peripheral edema.  No tenderness palpation of the calves.  Strength intact bilaterally.  Sensation intact bilaterally.  No saddle paresthesias.  Neurological: She is alert and oriented to person, place, and time. No sensory deficit.  Skin: Skin is warm and dry. Capillary refill takes less than 2 seconds.  Psychiatric: She has a normal mood and affect.  Nursing note and vitals reviewed.    ED Treatments / Results  Labs (all labs ordered are listed, but only abnormal results are displayed) Labs Reviewed  COMPREHENSIVE METABOLIC PANEL - Abnormal; Notable for the following components:      Result Value   Glucose, Bld 109 (*)    All other components within normal limits  CBC - Abnormal; Notable for the following components:   WBC 3.6 (*)    All other components within normal limits  URINALYSIS, ROUTINE W REFLEX MICROSCOPIC - Abnormal; Notable for the following components:   Hgb urine dipstick TRACE (*)    All other components within normal limits  URINALYSIS, MICROSCOPIC (REFLEX) - Abnormal; Notable for the following components:   Bacteria, UA MANY  (*)    All other components within normal limits  URINE CULTURE  LIPASE, BLOOD    EKG None  Radiology No results found.  Procedures Procedures (including critical care time)  Medications Ordered in ED Medications  sodium chloride 0.9 % bolus 500 mL (0 mLs Intravenous Stopped 04/01/18 1057)  ketorolac (TORADOL) 15 MG/ML injection 15 mg (15 mg Intravenous Given 04/01/18 1025)  acetaminophen (TYLENOL) tablet 650 mg (650 mg Oral Given 04/01/18 1035)     Initial Impression / Assessment and Plan / ED Course  I have reviewed the triage vital signs and the nursing notes.  Pertinent labs & imaging results that were available during my care of the patient were reviewed by me and considered in my medical decision making (see chart for details).     Patient presenting for evaluation of bilateral leg pain.  Equal exam shows patient with low-grade fever of 100.2.  Otherwise exam is reassuring.  No obvious signs of infection.  No obvious swelling or reproducible tenderness, low suspicion for blood clot.  Consider viral illness causing myalgias.  Patient reports decreased fluid intake yesterday, only had a Coke.  Consider dehydration.  Labs reassuring, patient was stable leukopenia.  No electrolyte abnormalities.  Urine without infection.  No back pain or reproducible tenderness.  Doubt sciatica or neurologic etiology.  Nausea/vomiting and abdominal pain has resolved, I do not believe CT scan of the abdomen is necessary.  Will give bolus of fluids and Toradol and reassess.  On reassessment, patient reports she is pain-free.  Fever has improved. discussed findings with pt, discussed this is likely myalgias, consider source of fever vs dehydration vs viral. Pt to tx symptomatically, f/u with PCP if not improving. Case discussed with attending, Dr. Donnald GarrePfeiffer evaluated the pt. At this time, pt appears safe for d/c. Return precautions given. Pt states she understands and agrees to plan.   Final Clinical  Impressions(s) / ED Diagnoses   Final diagnoses:  Bilateral leg pain  Myalgia    ED Discharge Orders    None       Alveria ApleyCaccavale, Collin Hendley, PA-C 04/01/18 1224    Arby BarrettePfeiffer, Marcy, MD 04/02/18 [phone]

## 2018-04-01 NOTE — ED Provider Notes (Signed)
Medical screening examination/treatment/procedure(s) were conducted as a shared visit with non-physician practitioner(s) and myself.  I personally evaluated the patient during the encounter.  None Patient reports her predominant symptom was a lot of aching in both of her legs 2 days ago.  She reports they felt really tight back in the hamstrings.  She reports it was so uncomfortable is almost hard for her to stand up straight.  She denies that she had any pain in her lower back.  She did not have numbness or weakness.  She reports she was having some achy abdominal cramps and some vomiting yesterday.  I am seeing the patient after she has had some Toradol Tylenol and 500 mL's of fluid.  She reports that she feels "wonderful".  Patient is alert and nontoxic.  No respiratory distress.  Abdomen is completely soft and nontender.  Examination of lower extremities shows them to be normal in appearance with excellent skin condition.  Soft tissues are normal to palpation.  No peripheral edema.  Calves are soft and nontender.  No groin tenderness to palpation.  Dorsalis pedis pulses are 2+ and strong.  Feet are in very good condition.  At this time, it is unclear whether patient had posterior leg pain.  Physical exam does not suggest any infectious or vascular etiology.  She does not give a history suggestive of claudication.  She is clinically well in appearance.  Symptoms have been resolved with Toradol Tylenol and fluids.  At this time, I feel she is safe for close follow-up with PCP.  Return precautions reviewed.    Arby BarrettePfeiffer, Valdis Bevill, MD 04/01/18 [phone]

## 2018-04-01 NOTE — Discharge Instructions (Addendum)
Your pain may be due to muscle aches from a virus. Take Tylenol and ibuprofen as needed for pain. Make sure you are staying well-hydrated with water.  This is very important.  Your urine should be clear to pale yellow. Return to the emergency room if you develop numbness of your legs, inability to walk, loss of bowel bladder control.  Return with any new, worsening, concerning symptoms.

## 2018-04-01 NOTE — ED Triage Notes (Signed)
Pt c/o B/L leg cramps since Wednesday. Pain occurs with sitting. Standing or laying down. Pt also c/o abdominal cramps 2 days ago with vomiting and having a headache yesterday.

## 2018-04-02 ENCOUNTER — Emergency Department (HOSPITAL_BASED_OUTPATIENT_CLINIC_OR_DEPARTMENT_OTHER)
Admission: EM | Admit: 2018-04-02 | Discharge: 2018-04-03 | Disposition: A | Discharge status: Home / Self Care | Payer: Medicare Other | Attending: Emergency Medicine | Admitting: Emergency Medicine

## 2018-04-02 ENCOUNTER — Emergency Department (HOSPITAL_BASED_OUTPATIENT_CLINIC_OR_DEPARTMENT_OTHER): Payer: Medicare Other

## 2018-04-02 ENCOUNTER — Encounter (HOSPITAL_BASED_OUTPATIENT_CLINIC_OR_DEPARTMENT_OTHER): Payer: Self-pay | Admitting: *Deleted

## 2018-04-02 ENCOUNTER — Other Ambulatory Visit: Payer: Self-pay

## 2018-04-02 DIAGNOSIS — Z79899 Other long term (current) drug therapy: Secondary | ICD-10-CM | POA: Diagnosis not present

## 2018-04-02 DIAGNOSIS — R112 Nausea with vomiting, unspecified: Secondary | ICD-10-CM | POA: Diagnosis not present

## 2018-04-02 DIAGNOSIS — M79605 Pain in left leg: Secondary | ICD-10-CM | POA: Diagnosis not present

## 2018-04-02 DIAGNOSIS — M79604 Pain in right leg: Secondary | ICD-10-CM | POA: Insufficient documentation

## 2018-04-02 DIAGNOSIS — R0602 Shortness of breath: Secondary | ICD-10-CM | POA: Diagnosis not present

## 2018-04-02 DIAGNOSIS — I10 Essential (primary) hypertension: Secondary | ICD-10-CM | POA: Insufficient documentation

## 2018-04-02 DIAGNOSIS — R509 Fever, unspecified: Secondary | ICD-10-CM | POA: Diagnosis not present

## 2018-04-02 LAB — CK: CK TOTAL: 112 U/L (ref 38–234)

## 2018-04-02 LAB — URINALYSIS, ROUTINE W REFLEX MICROSCOPIC
Bilirubin Urine: NEGATIVE
GLUCOSE, UA: NEGATIVE mg/dL
KETONES UR: 15 mg/dL — AB
LEUKOCYTES UA: NEGATIVE
Nitrite: NEGATIVE
Protein, ur: NEGATIVE mg/dL
Specific Gravity, Urine: 1.02 (ref 1.005–1.030)
pH: 6 (ref 5.0–8.0)

## 2018-04-02 LAB — COMPREHENSIVE METABOLIC PANEL
ALBUMIN: 3.5 g/dL (ref 3.5–5.0)
ALK PHOS: 51 U/L (ref 38–126)
ALT: 12 U/L (ref 0–44)
AST: 16 U/L (ref 15–41)
Anion gap: 8 (ref 5–15)
BUN: 17 mg/dL (ref 8–23)
CO2: 26 mmol/L (ref 22–32)
Calcium: 8.9 mg/dL (ref 8.9–10.3)
Chloride: 105 mmol/L (ref 98–111)
Creatinine, Ser: 0.87 mg/dL (ref 0.44–1.00)
GFR calc non Af Amer: >60 mL/min (ref >60)
Glucose, Bld: 119 mg/dL — ABNORMAL HIGH (ref 70–99)
Potassium: 3.6 mmol/L (ref 3.5–5.1)
Sodium: 139 mmol/L (ref 135–145)
Total Bilirubin: 0.5 mg/dL (ref 0.3–1.2)
Total Protein: 6.8 g/dL (ref 6.5–8.1)

## 2018-04-02 LAB — URINALYSIS, MICROSCOPIC (REFLEX)

## 2018-04-02 LAB — CBC WITH DIFFERENTIAL/PLATELET
ABS IMMATURE GRANULOCYTES: 0.01 10*3/uL (ref 0.00–0.07)
BASOS ABS: 0 10*3/uL (ref 0.0–0.1)
BASOS PCT: 0 %
Eosinophils Absolute: 0 10*3/uL (ref 0.0–0.5)
Eosinophils Relative: 1 %
HCT: 39 % (ref 36.0–46.0)
Hemoglobin: 12.1 g/dL (ref 12.0–15.0)
IMMATURE GRANULOCYTES: 0 %
Lymphocytes Relative: 23 %
Lymphs Abs: 1.1 10*3/uL (ref 0.7–4.0)
MCH: 27.3 pg (ref 26.0–34.0)
MCHC: 31 g/dL (ref 30.0–36.0)
MCV: 87.8 fL (ref 80.0–100.0)
Monocytes Absolute: 0.4 10*3/uL (ref 0.1–1.0)
Monocytes Relative: 9 %
NEUTROS ABS: 3.3 10*3/uL (ref 1.7–7.7)
NRBC: 0 % (ref 0.0–0.2)
Neutrophils Relative %: 67 %
PLATELETS: 247 10*3/uL (ref 150–400)
RBC: 4.44 MIL/uL (ref 3.87–5.11)
RDW: 13.7 % (ref 11.5–15.5)
WBC: 4.9 10*3/uL (ref 4.0–10.5)

## 2018-04-02 LAB — URINE CULTURE: Culture: NO GROWTH

## 2018-04-02 LAB — D-DIMER, QUANTITATIVE (NOT AT ARMC): D-Dimer, Quant: 0.73 ug/mL-FEU — ABNORMAL HIGH (ref 0.00–0.50)

## 2018-04-02 LAB — BRAIN NATRIURETIC PEPTIDE: B Natriuretic Peptide: 45.5 pg/mL (ref 0.0–100.0)

## 2018-04-02 LAB — TROPONIN I

## 2018-04-02 MED ORDER — ONDANSETRON HCL 4 MG/2ML IJ SOLN
4.0000 mg | Freq: Once | INTRAMUSCULAR | Status: AC
  Administered 2018-04-02: 4 mg via INTRAVENOUS
  Filled 2018-04-02: qty 2

## 2018-04-02 MED ORDER — LORAZEPAM 2 MG/ML IJ SOLN
1.0000 mg | Freq: Once | INTRAMUSCULAR | Status: AC
  Administered 2018-04-02: 1 mg via INTRAVENOUS
  Filled 2018-04-02: qty 1

## 2018-04-02 MED ORDER — ONDANSETRON HCL 4 MG/2ML IJ SOLN
INTRAMUSCULAR | Status: AC
  Filled 2018-04-02: qty 2

## 2018-04-02 MED ORDER — SODIUM CHLORIDE 0.9 % IV BOLUS
1000.0000 mL | Freq: Once | INTRAVENOUS | Status: AC
  Administered 2018-04-02: 1000 mL via INTRAVENOUS

## 2018-04-02 MED ORDER — ACETAMINOPHEN 500 MG PO TABS
1000.0000 mg | ORAL_TABLET | Freq: Once | ORAL | Status: AC
  Administered 2018-04-02: 1000 mg via ORAL
  Filled 2018-04-02: qty 2

## 2018-04-02 MED ORDER — IOPAMIDOL (ISOVUE-370) INJECTION 76%
100.0000 mL | Freq: Once | INTRAVENOUS | Status: AC | PRN
  Administered 2018-04-02: 100 mL via INTRAVENOUS

## 2018-04-02 MED ORDER — ONDANSETRON HCL 4 MG/2ML IJ SOLN
4.0000 mg | Freq: Once | INTRAMUSCULAR | Status: AC
  Administered 2018-04-02: 4 mg via INTRAVENOUS

## 2018-04-02 NOTE — ED Provider Notes (Signed)
[facility] EMERGENCY DEPARTMENT Provider Note   CSN: [id number] Arrival date & time: 04/02/18  1943     History   Chief Complaint Chief Complaint  Patient presents with  . Shortness of Breath  . Leg Pain    HPI Stephanie Stephenson is a 67 y.o. female.  67 year old female with past medical history including hypertension who presents with multiple complaints.  She presented here yesterday for 3 days of abdominal cramping associated with a few episodes of vomiting as well as bilateral aching thigh pain.  She had borderline fever yesterday.  Was discharged home after reassuring work-up, was given fluids and pain relief.  She returns today stating that her symptoms have persisted and her bilateral leg pain has worsened.  She feels like the pain is now traveling up her posterior thighs to her buttocks and across her lower back.  She has a distant history of low back pain but denies any recent change in physical activity or trauma.  The pain stops at her thighs and does not go down her entire legs.  No associated leg weakness or numbness.  No bowel or bladder incontinence or saddle anesthesia.  She is not aware of any fevers at home.  She continues to have intermittent abdominal cramping and had some vomiting just prior to arrival.  This prior to arrival she began having some shortness of breath as well.  She denies any associated chest pain.  No recent weight gain or leg swelling.  No diarrhea or blood in her stools.  She has mild occasional cough but no sore throat or runny nose.  No sick contacts.  The history is provided by the patient.  Shortness of Breath  Associated symptoms include leg pain.  Leg Pain      Past Medical History:  Diagnosis Date  . Back pain   . Hypertension   . Varicose veins     Patient Active Problem List   Diagnosis Date Noted  . Varicose veins of leg with complications 09/03/2014  . Varicose veins of lower extremities with complications 07/30/2014     Past Surgical History:  Procedure Laterality Date  . BREAST REDUCTION SURGERY  08/30/2012  . ENDOVENOUS ABLATION SAPHENOUS VEIN W/ LASER Right 08-29-2014   endovenous laser ablation right anterior branch of GSV, sclerotherapy (right leg), stab phlebectomy 10-20 incisions (right leg) by Gretta Began MD  . ENDOVENOUS ABLATION SAPHENOUS VEIN W/ LASER Right 10-03-2014   endovenous laser ablation right greater saphenous vein by Gretta Began MD  . ENDOVENOUS ABLATION SAPHENOUS VEIN W/ LASER Left 01-02-2015   endovenous laser ablation left greater saphenous and stab phlebectomy 10-20 incisions left leg      OB History   None      Home Medications    Prior to Admission medications   Medication Sig Start Date End Date Taking? Authorizing Provider  amLODipine (NORVASC) 5 MG tablet Take 5 mg by mouth daily.    [provider]  gabapentin (NEURONTIN) 300 MG capsule Take 300 mg by mouth 3 (three) times daily.    [provider]  naproxen (NAPROSYN) 500 MG tablet Take 1 tablet (500 mg total) by mouth 2 (two) times daily. Patient not taking: Reported on 01/09/2015 07/20/14   Rolan Bucco, MD  ondansetron (ZOFRAN ODT) 4 MG disintegrating tablet Take 1 tablet (4 mg total) by mouth every 8 (eight) hours as needed for nausea or vomiting. 04/03/18   Faithanne Verret, Ambrose Finland, MD  valsartan-hydrochlorothiazide (DIOVAN-HCT) 320-12.5 MG per tablet  Take 1 tablet by mouth daily.    [provider]    Family History Family History  Problem Relation Age of Onset  . Hypertension Mother   . Varicose Veins Mother   . Hypertension Father     Social History Social History   Tobacco Use  . Smoking status: Never Smoker  . Smokeless tobacco: Never Used  Substance Use Topics  . Alcohol use: No    Alcohol/week: 0.0 standard drinks  . Drug use: No     Allergies   Patient has no known allergies.   Review of Systems Review of Systems  Respiratory: Positive for shortness of  breath.    All other systems reviewed and are negative except that which was mentioned in HPI   Physical Exam Updated Vital Signs BP (!) 154/79   Pulse 73   Temp (!) 100.8 F (38.2 C) (Oral)   Resp 17   Wt 97.2 kg   SpO2 92%   BMI 41.85 kg/m   Physical Exam  Constitutional: She is oriented to person, place, and time. She appears well-developed and well-nourished.  In mild distress due to pain  HENT:  Head: Normocephalic and atraumatic.  Moist mucous membranes  Eyes: Pupils are equal, round, and reactive to light. Conjunctivae are normal.  Neck: Neck supple.  Cardiovascular: Normal rate, regular rhythm, normal heart sounds and intact distal pulses.  No murmur heard. Pulmonary/Chest: Effort normal and breath sounds normal.  Abdominal: Soft. Bowel sounds are normal. She exhibits no distension. There is no tenderness.  Musculoskeletal: She exhibits no edema.       Right lower leg: She exhibits no tenderness and no edema.       Left lower leg: She exhibits no tenderness and no edema.  No focal leg, back or buttock tenderness  Neurological: She is alert and oriented to person, place, and time.  Fluent speech  Skin: Skin is warm and dry. No rash noted. No erythema.  Psychiatric: Judgment normal.  Anxious, distressed  Nursing note and vitals reviewed.    ED Treatments / Results  Labs (all labs ordered are listed, but only abnormal results are displayed) Labs Reviewed  COMPREHENSIVE METABOLIC PANEL - Abnormal; Notable for the following components:      Result Value   Glucose, Bld 119 (*)    All other components within normal limits  D-DIMER, QUANTITATIVE (NOT AT [facility]) - Abnormal; Notable for the following components:   D-Dimer, Quant 0.73 (*)    All other components within normal limits  URINALYSIS, ROUTINE W REFLEX MICROSCOPIC - Abnormal; Notable for the following components:   Hgb urine dipstick TRACE (*)    Ketones, ur 15 (*)    All other components within normal  limits  URINALYSIS, MICROSCOPIC (REFLEX) - Abnormal; Notable for the following components:   Bacteria, UA RARE (*)    All other components within normal limits  URINE CULTURE  CBC WITH DIFFERENTIAL/PLATELET  BRAIN NATRIURETIC PEPTIDE  TROPONIN I  CK    EKG EKG Interpretation  Date/Time:  Sunday April 02 2018 19:51:31 EST Ventricular Rate:  61 PR Interval:    QRS Duration: 97 QT Interval:  395 QTC Calculation: 398 R Axis:   -9 Text Interpretation:  Sinus rhythm Atrial premature complex No previous ECGs available Confirmed by Frederick Peers [phone]) on 04/02/2018 8:45:20 PM   Radiology Dg Chest 2 View  Result Date: 04/02/2018 CLINICAL DATA:  Shortness of breath. Bilateral lower extremity pain. Worse since yesterday. Cough. Hypertension. Nonsmoker. EXAM:  CHEST - 2 VIEW COMPARISON:  None. FINDINGS: The heart size and mediastinal contours are within normal limits. Both lungs are clear. The visualized skeletal structures are unremarkable. IMPRESSION: No active cardiopulmonary disease. Electronically Signed   By: Burman NievesWilliam  Stevens M.D.   On: 04/02/2018 21:38   Ct Angio Chest Pe W/cm &/or Wo Cm  Result Date: 04/02/2018 CLINICAL DATA:  Bilateral leg cramps since Wednesday. Abdominal cramps 2 days ago. Vomiting. Headache yesterday. History of hypertension. EXAM: CT ANGIOGRAPHY CHEST CT ABDOMEN AND PELVIS WITH CONTRAST TECHNIQUE: Multidetector CT imaging of the chest was performed using the standard protocol during bolus administration of intravenous contrast. Multiplanar CT image reconstructions and MIPs were obtained to evaluate the vascular anatomy. Multidetector CT imaging of the abdomen and pelvis was performed using the standard protocol during bolus administration of intravenous contrast. CONTRAST:  100mL ISOVUE-370 IOPAMIDOL (ISOVUE-370) INJECTION 76% COMPARISON:  None. FINDINGS: CTA CHEST FINDINGS Cardiovascular: Examination is technically limited by motion artifact. There is  moderately good opacification of the central and segmental pulmonary arteries. No focal filling defects. No evidence of significant central pulmonary embolus. Normal caliber thoracic aorta. No aortic dissection. Great vessel origins are patent. Normal heart size. No pericardial effusions. Mediastinum/Nodes: No significant lymphadenopathy in the chest. Esophagus is decompressed. Lungs/Pleura: Motion artifact limits examination. Dependent changes in the lung bases. No airspace disease or consolidation is appreciated. No pleural effusions. No pneumothorax. Airways are patent. Musculoskeletal: Degenerative changes in the spine. No destructive bone lesions. Review of the MIP images confirms the above findings. CT ABDOMEN and PELVIS FINDINGS Hepatobiliary: No focal liver abnormality is seen. No gallstones, gallbladder wall thickening, or biliary dilatation. Pancreas: Unremarkable. No pancreatic ductal dilatation or surrounding inflammatory changes. Spleen: Normal in size without focal abnormality. Adrenals/Urinary Tract: Adrenal glands are unremarkable. Kidneys are normal, without renal calculi, focal lesion, or hydronephrosis. Bladder is unremarkable. Stomach/Bowel: Stomach, small bowel, and colon are mostly decompressed. No wall thickening or inflammatory changes are appreciated although under distention limits evaluation of bowel wall. Appendix is normal. Vascular/Lymphatic: No significant vascular findings are present. No enlarged abdominal or pelvic lymph nodes. Reproductive: Nodular enlargement of the uterus with scattered calcifications consistent with uterine fibroids. No abnormal adnexal masses. Other: No free air or free fluid in the abdomen. Abdominal wall musculature appears intact. Minimal umbilical hernia containing fat. Musculoskeletal: Degenerative changes in the spine. Spondylolysis with mild spondylolisthesis at L4-5. No destructive bone lesions. Review of the MIP images confirms the above findings.  IMPRESSION: 1. No evidence of significant pulmonary embolus. 2. No evidence of active pulmonary disease. 3. No acute process demonstrated in the abdomen or pelvis. 4. Uterine fibroids. 5. Spondylolysis with mild spondylolisthesis at L4-5. Electronically Signed   By: Burman NievesWilliam  Stevens M.D.   On: 04/02/2018 22:46   Ct Abdomen Pelvis W Contrast  Result Date: 04/02/2018 CLINICAL DATA:  Bilateral leg cramps since Wednesday. Abdominal cramps 2 days ago. Vomiting. Headache yesterday. History of hypertension. EXAM: CT ANGIOGRAPHY CHEST CT ABDOMEN AND PELVIS WITH CONTRAST TECHNIQUE: Multidetector CT imaging of the chest was performed using the standard protocol during bolus administration of intravenous contrast. Multiplanar CT image reconstructions and MIPs were obtained to evaluate the vascular anatomy. Multidetector CT imaging of the abdomen and pelvis was performed using the standard protocol during bolus administration of intravenous contrast. CONTRAST:  100mL ISOVUE-370 IOPAMIDOL (ISOVUE-370) INJECTION 76% COMPARISON:  None. FINDINGS: CTA CHEST FINDINGS Cardiovascular: Examination is technically limited by motion artifact. There is moderately good opacification of the central and segmental pulmonary arteries. No  focal filling defects. No evidence of significant central pulmonary embolus. Normal caliber thoracic aorta. No aortic dissection. Great vessel origins are patent. Normal heart size. No pericardial effusions. Mediastinum/Nodes: No significant lymphadenopathy in the chest. Esophagus is decompressed. Lungs/Pleura: Motion artifact limits examination. Dependent changes in the lung bases. No airspace disease or consolidation is appreciated. No pleural effusions. No pneumothorax. Airways are patent. Musculoskeletal: Degenerative changes in the spine. No destructive bone lesions. Review of the MIP images confirms the above findings. CT ABDOMEN and PELVIS FINDINGS Hepatobiliary: No focal liver abnormality is seen.  No gallstones, gallbladder wall thickening, or biliary dilatation. Pancreas: Unremarkable. No pancreatic ductal dilatation or surrounding inflammatory changes. Spleen: Normal in size without focal abnormality. Adrenals/Urinary Tract: Adrenal glands are unremarkable. Kidneys are normal, without renal calculi, focal lesion, or hydronephrosis. Bladder is unremarkable. Stomach/Bowel: Stomach, small bowel, and colon are mostly decompressed. No wall thickening or inflammatory changes are appreciated although under distention limits evaluation of bowel wall. Appendix is normal. Vascular/Lymphatic: No significant vascular findings are present. No enlarged abdominal or pelvic lymph nodes. Reproductive: Nodular enlargement of the uterus with scattered calcifications consistent with uterine fibroids. No abnormal adnexal masses. Other: No free air or free fluid in the abdomen. Abdominal wall musculature appears intact. Minimal umbilical hernia containing fat. Musculoskeletal: Degenerative changes in the spine. Spondylolysis with mild spondylolisthesis at L4-5. No destructive bone lesions. Review of the MIP images confirms the above findings. IMPRESSION: 1. No evidence of significant pulmonary embolus. 2. No evidence of active pulmonary disease. 3. No acute process demonstrated in the abdomen or pelvis. 4. Uterine fibroids. 5. Spondylolysis with mild spondylolisthesis at L4-5. Electronically Signed   By: Burman Nieves M.D.   On: 04/02/2018 22:46    Procedures Procedures (including critical care time)  Medications Ordered in ED Medications  ondansetron (ZOFRAN) injection 4 mg (4 mg Intravenous Given 04/02/18 2135)  sodium chloride 0.9 % bolus 1,000 mL (0 mLs Intravenous Stopped 04/02/18 2313)  LORazepam (ATIVAN) injection 1 mg (1 mg Intravenous Given 04/02/18 2136)  acetaminophen (TYLENOL) tablet 1,000 mg (1,000 mg Oral Given 04/02/18 2239)  iopamidol (ISOVUE-370) 76 % injection 100 mL (100 mLs Intravenous Contrast  Given 04/02/18 2206)  ondansetron (ZOFRAN) injection 4 mg (4 mg Intravenous Given 04/02/18 2319)     Initial Impression / Assessment and Plan / ED Course  I have reviewed the triage vital signs and the nursing notes.  Pertinent labs & imaging results that were available during my care of the patient were reviewed by me and considered in my medical decision making (see chart for details).    She was in mild distress on exam due to her bilateral leg pain.  She had mild fever of 100.8 but remainder vital signs reassuring.  She had no meningismus or other concerning features to suggest meningitis or encephalitis.  No abnormalities on visual exam of legs and no focal areas of tenderness to suggest infectious process.  She has no risk factors to suggest discitis or other acute spinal infection.  Because of her report of shortness of breath, obtained d-dimer which was mildly elevated.  Obtain CTA of chest which was negative for PE, infiltrate, or fluid.  Because of her reports of abdominal pain and her presence of vomiting and fever, obtained CT of abdomen and pelvis as well which was negative for acute process to explain her symptoms.  Lab work including CBC, CMP, and UA is unremarkable.  She denies any symptoms to suggest urinary process.  I discussed the possibility  of a viral illness given her fever, body pain, vomiting, and report a mild cough.  After receiving above medications she was able to drink water and had no ongoing nausea.  I have discussed work-up findings and treatment plan with patient and husband.  Instructed her to follow-up with PCP in 2 days for reassessment.  Extensively reviewed return precautions with her.  She voiced understanding.  Final Clinical Impressions(s) / ED Diagnoses   Final diagnoses:  Fever, unspecified fever cause  Bilateral leg pain  Non-intractable vomiting with nausea, unspecified vomiting type  Shortness of breath    ED Discharge Orders         Ordered     ondansetron (ZOFRAN ODT) 4 MG disintegrating tablet  Every 8 hours PRN     04/03/18 0010           Krikor Willet, Ambrose Finland, MD 04/03/18 [phone]

## 2018-04-02 NOTE — ED Notes (Signed)
Called this RN into the room - the patient had reports of chest pain - when entered room patient is throwing up  - support given and meds given  - MD aware   - patient denies any Chest pain post vomiting

## 2018-04-02 NOTE — ED Triage Notes (Signed)
Pt seen here yesterday. States she is having bilateral leg pain and began feeling SOB just pta

## 2018-04-03 MED ORDER — ONDANSETRON 4 MG PO TBDP: 4.0000 mg | ORAL_TABLET | Freq: Three times a day (TID) | ORAL | 0 refills | Status: DC | PRN

## 2018-04-03 NOTE — Discharge Instructions (Addendum)
See your primary care provider in 2 days for recheck.  Drink plenty of fluids.  Take Tylenol and ibuprofen for your pain and if you develop any fevers.  Return to ER if you have any worsening symptoms including severe vomiting, severe worsening of abdominal pain, chest pain, worsening fevers, confusion, or other major concerns.

## 2018-04-04 LAB — URINE CULTURE

## 2019-09-05 DIAGNOSIS — M19072 Primary osteoarthritis, left ankle and foot: Secondary | ICD-10-CM | POA: Insufficient documentation

## 2019-09-05 DIAGNOSIS — M2140 Flat foot [pes planus] (acquired), unspecified foot: Secondary | ICD-10-CM | POA: Insufficient documentation

## 2020-06-22 IMAGING — CT CT ANGIO CHEST
2 of 6 series · 18 of 36 positions shown · IV contrast (iopamidol)
Comparison: None.

CLINICAL DATA: Bilateral leg cramps since [REDACTED]. Abdominal
cramps 2 days ago. Vomiting. Headache yesterday. History of
hypertension.

EXAM:
CT ANGIOGRAPHY CHEST
CT ABDOMEN AND PELVIS WITH CONTRAST
TECHNIQUE: Multidetector CT imaging of the chest was performed using the
standard protocol during bolus administration of intravenous
contrast. Multiplanar CT image reconstructions and MIPs were
obtained to evaluate the vascular anatomy. Multidetector CT imaging
of the abdomen and pelvis was performed using the standard protocol
during bolus administration of intravenous contrast.
CONTRAST:  100mL IR909E-BP3 IOPAMIDOL (IR909E-BP3) INJECTION 76%

[Series 4: pe coronal mpr · coronal · 0.59mm/px · 1 of 151 slices shown]
[im 76/151  mediastinal]
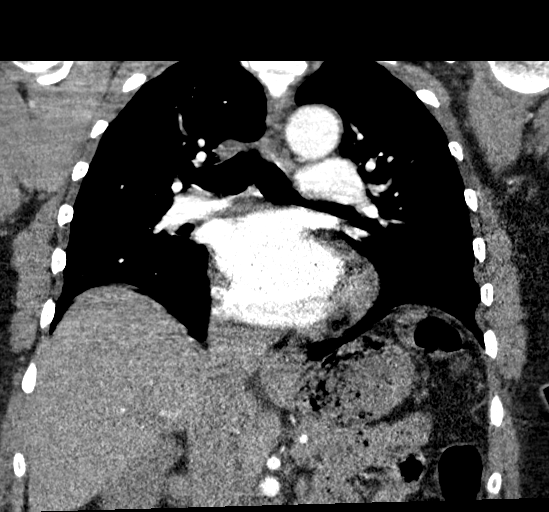

[Series 8: pe thins · axial · 0.69mm/px · z∈[-82,+180]mm · 17 of 292 slices shown]
[im 15/292  lung]
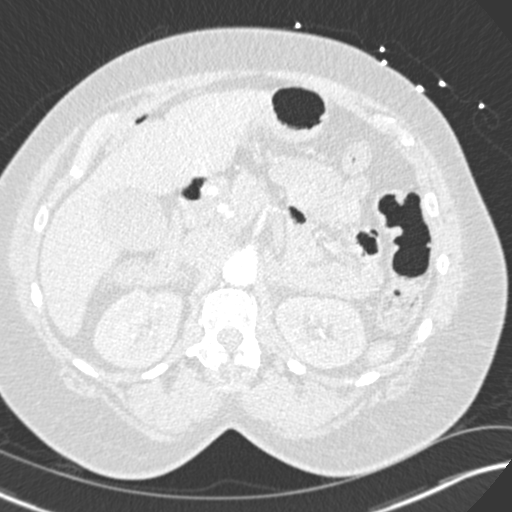
[im 30/292  mediastinal]
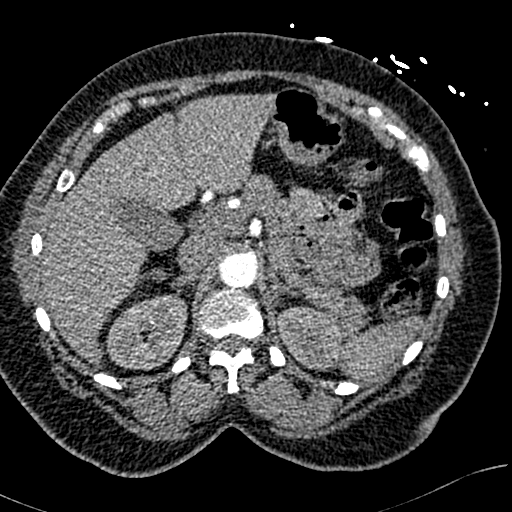
[im 44/292  lung]
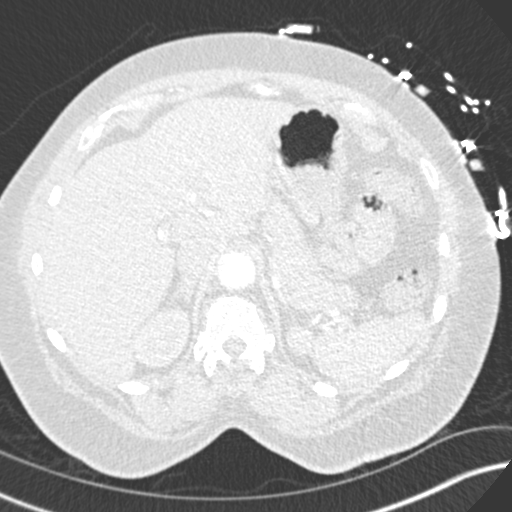
[im 59/292  mediastinal]
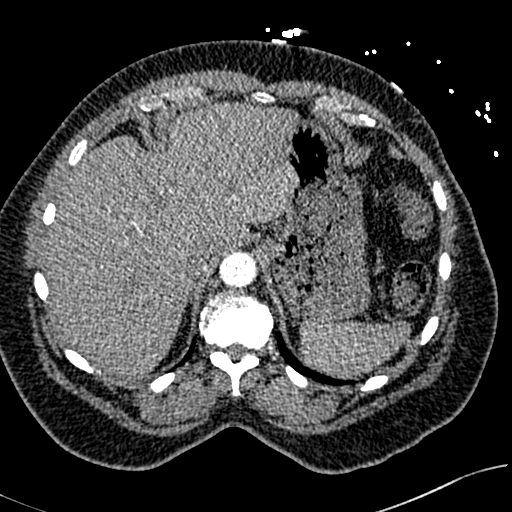
[im 88/292  lung]
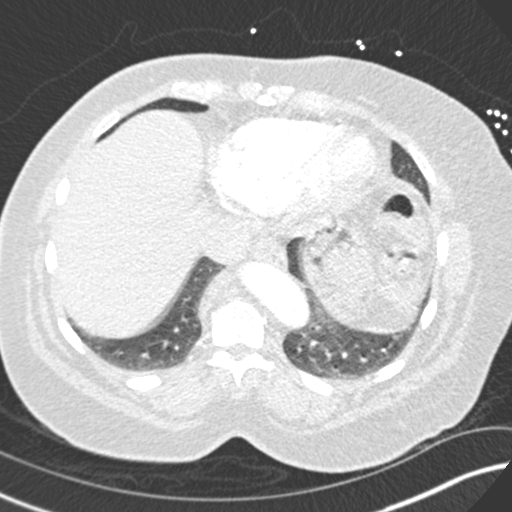
[im 102/292  mediastinal]
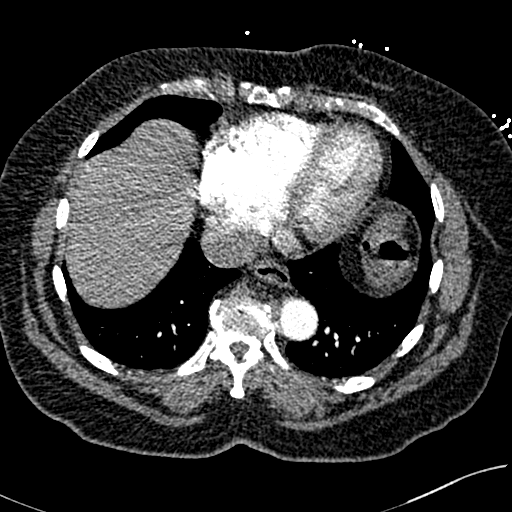
[im 117/292  lung]
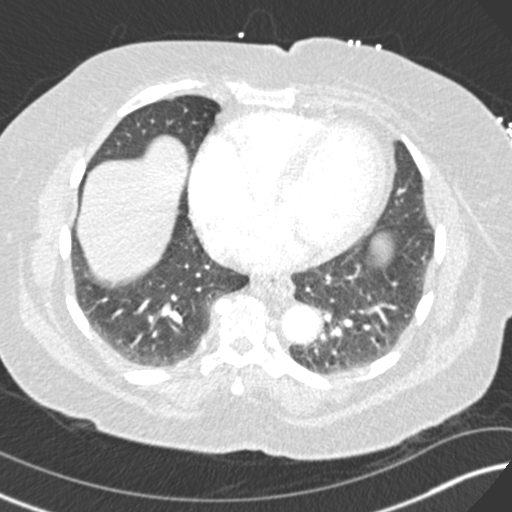
[im 131/292  mediastinal]
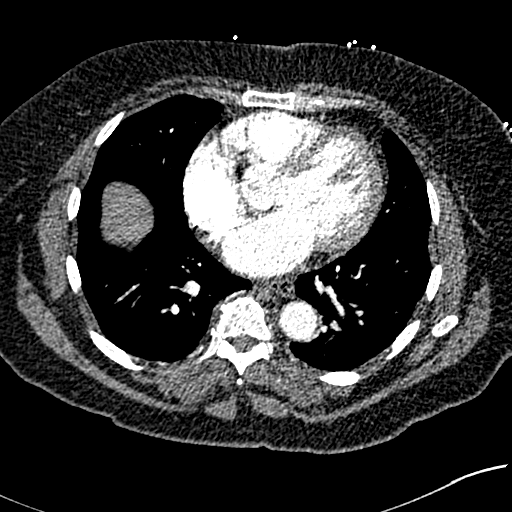
[im 146/292  lung]
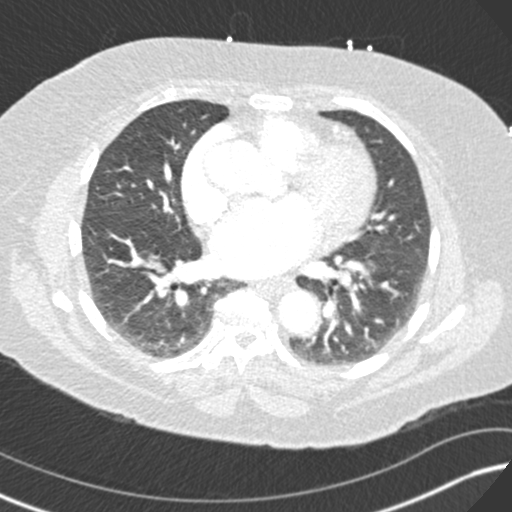
[im 161/292  mediastinal]
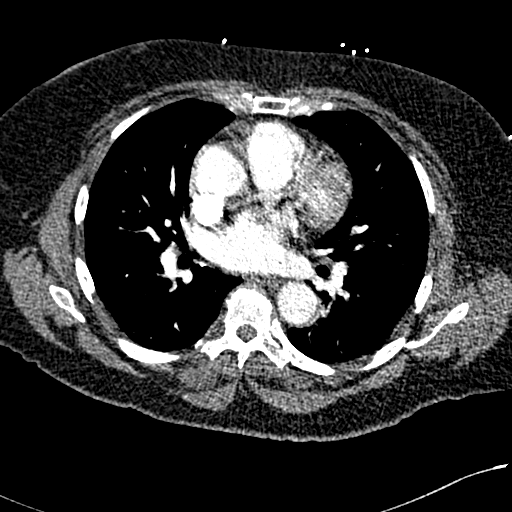
[im 175/292  lung]
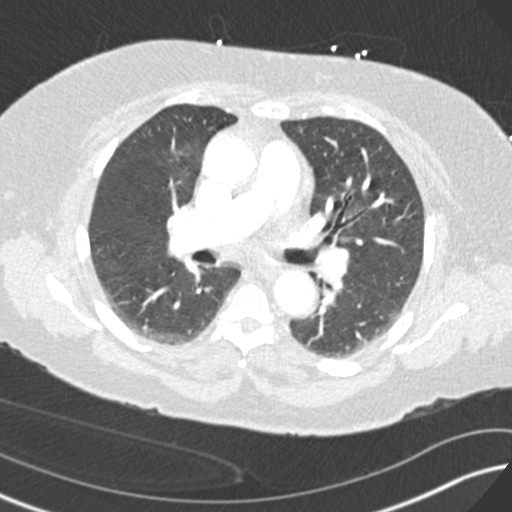
[im 190/292  mediastinal]
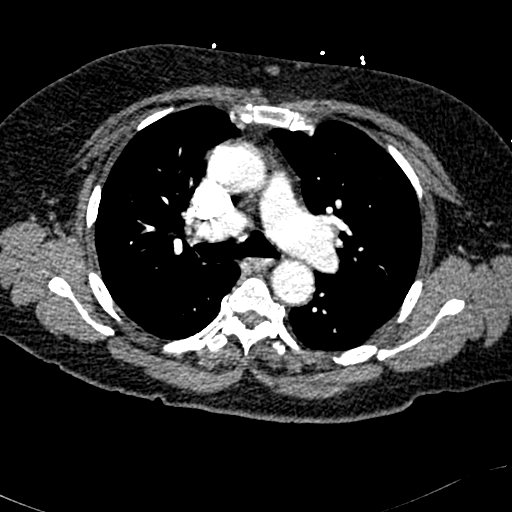
[im 204/292  lung]
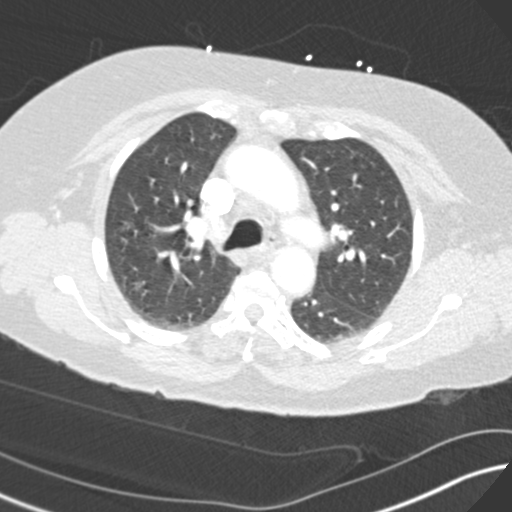
[im 233/292  mediastinal]
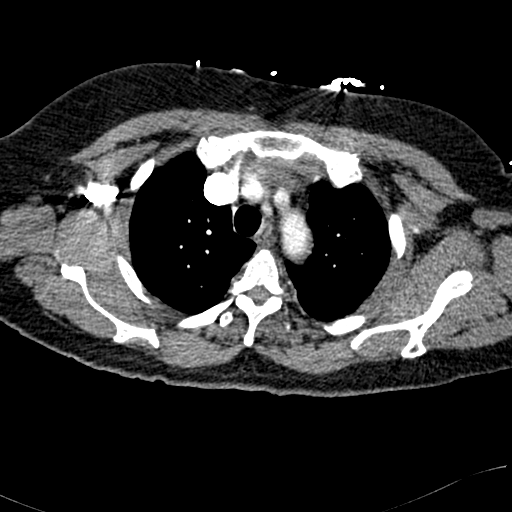
[im 248/292  lung]
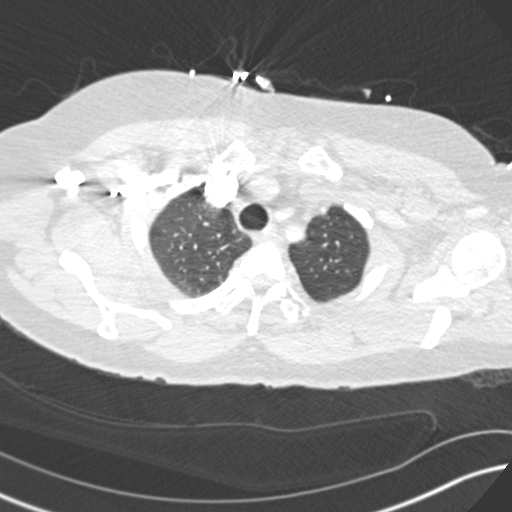
[im 262/292  mediastinal]
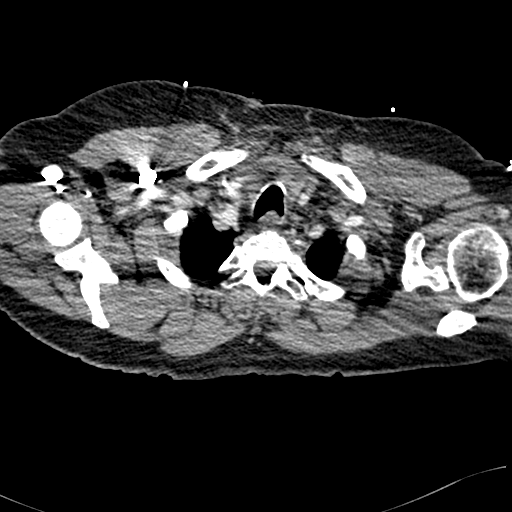
[im 277/292  lung]
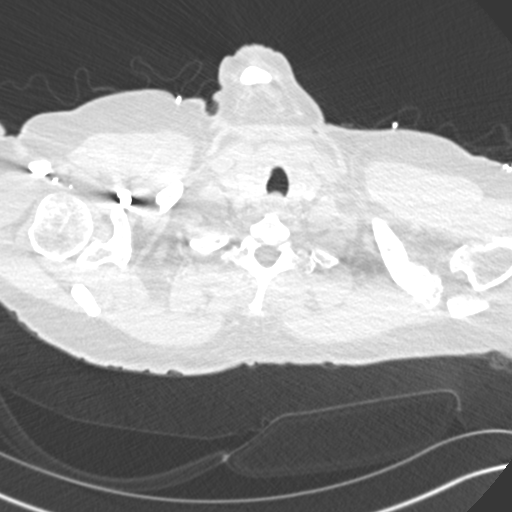

[18 of 36 positions shown; findings below may reference images not displayed]

FINDINGS: CTA CHEST FINDINGS

Cardiovascular: Examination is technically limited by motion
artifact. There is moderately good opacification of the central and
segmental pulmonary arteries. No focal filling defects. No evidence
of significant central pulmonary embolus. Normal caliber thoracic
aorta. No aortic dissection. Great vessel origins are patent. Normal
heart size. No pericardial effusions.

Mediastinum/Nodes: No significant lymphadenopathy in the chest.
Esophagus is decompressed.

Lungs/Pleura: Motion artifact limits examination. Dependent changes
in the lung bases. No airspace disease or consolidation is
appreciated. No pleural effusions. No pneumothorax. Airways are
patent.

Musculoskeletal: Degenerative changes in the spine. No destructive
bone lesions.

Review of the MIP images confirms the above findings.

CT ABDOMEN and PELVIS FINDINGS

Hepatobiliary: No focal liver abnormality is seen. No gallstones,
gallbladder wall thickening, or biliary dilatation.

Pancreas: Unremarkable. No pancreatic ductal dilatation or
surrounding inflammatory changes.

Spleen: Normal in size without focal abnormality.

Adrenals/Urinary Tract: Adrenal glands are unremarkable. Kidneys are
normal, without renal calculi, focal lesion, or hydronephrosis.
Bladder is unremarkable.

Stomach/Bowel: Stomach, small bowel, and colon are mostly
decompressed. No wall thickening or inflammatory changes are
appreciated although under distention limits evaluation of bowel
wall. Appendix is normal.

Vascular/Lymphatic: No significant vascular findings are present. No
enlarged abdominal or pelvic lymph nodes.

Reproductive: Nodular enlargement of the uterus with scattered
calcifications consistent with uterine fibroids. No abnormal adnexal
masses.

Other: No free air or free fluid in the abdomen. Abdominal wall
musculature appears intact. Minimal umbilical hernia containing fat.

Musculoskeletal: Degenerative changes in the spine. Spondylolysis
with mild spondylolisthesis at L4-5. No destructive bone lesions.

Review of the MIP images confirms the above findings.
IMPRESSION: 1. No evidence of significant pulmonary embolus.
2. No evidence of active pulmonary disease.
3. No acute process demonstrated in the abdomen or pelvis.
4. Uterine fibroids.
5. Spondylolysis with mild spondylolisthesis at L4-5.

## 2020-06-22 IMAGING — CT CT ABD-PELV W/ CM
2 of 5 series · 15 of 46 positions shown, 17 images · IV contrast (iopamidol)
Comparison: None.

CLINICAL DATA: Bilateral leg cramps since [REDACTED]. Abdominal
cramps 2 days ago. Vomiting. Headache yesterday. History of
hypertension.

EXAM:
CT ANGIOGRAPHY CHEST
CT ABDOMEN AND PELVIS WITH CONTRAST
TECHNIQUE: Multidetector CT imaging of the chest was performed using the
standard protocol during bolus administration of intravenous
contrast. Multiplanar CT image reconstructions and MIPs were
obtained to evaluate the vascular anatomy. Multidetector CT imaging
of the abdomen and pelvis was performed using the standard protocol
during bolus administration of intravenous contrast.
CONTRAST:  100mL IR909E-BP3 IOPAMIDOL (IR909E-BP3) INJECTION 76%

[Series 4: axial st · axial · 0.82mm/px · z∈[-393,+12]mm · 12 of 91 slices shown, 14 images]
[im 5/91  soft-tissue]
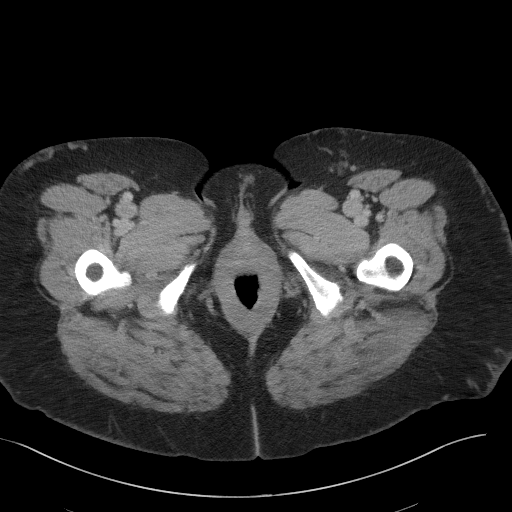
[im 5/91  bone]
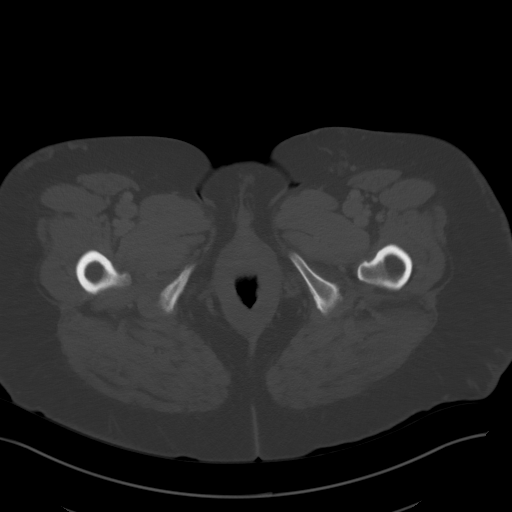
[im 15/91  soft-tissue]
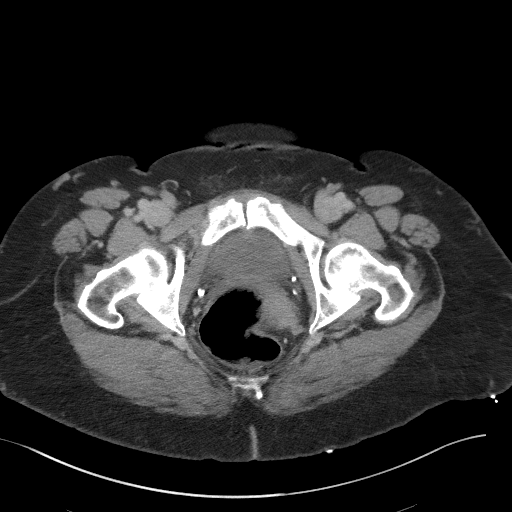
[im 19/91  soft-tissue]
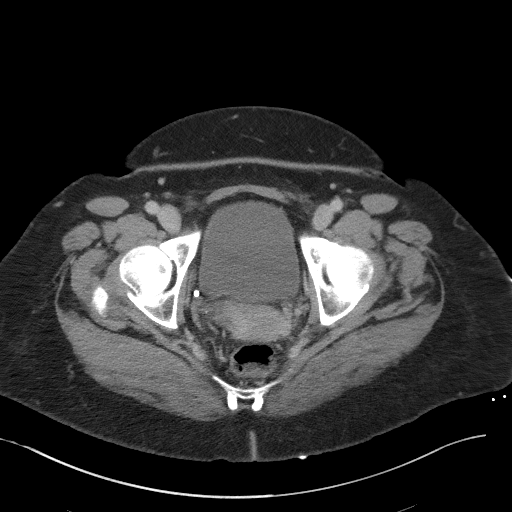
[im 29/91  soft-tissue]
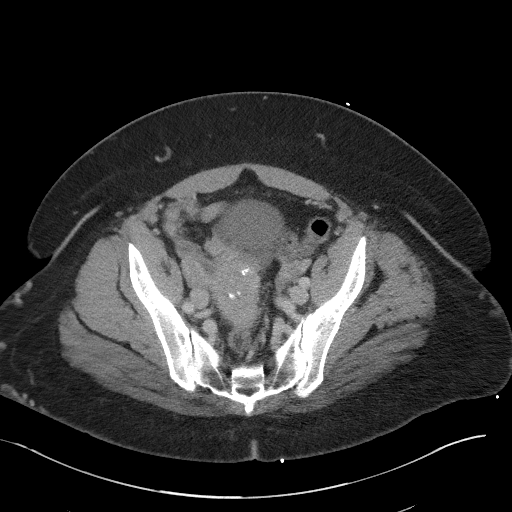
[im 34/91  soft-tissue]
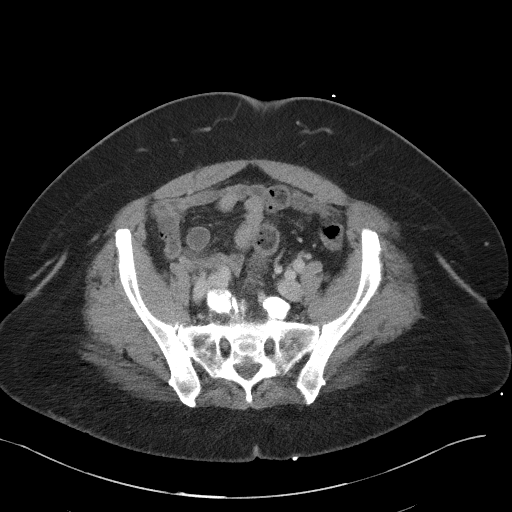
[im 43/91  soft-tissue]
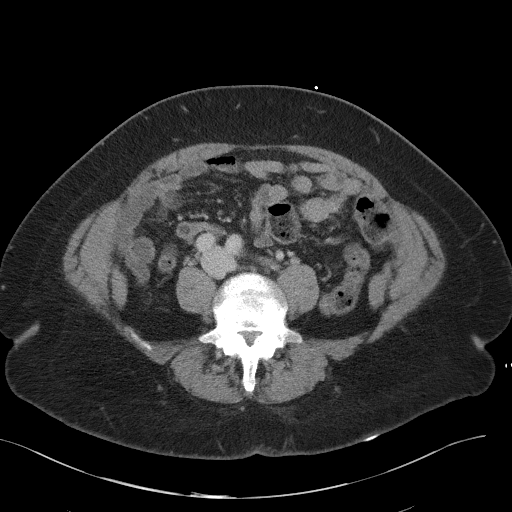
[im 48/91  soft-tissue]
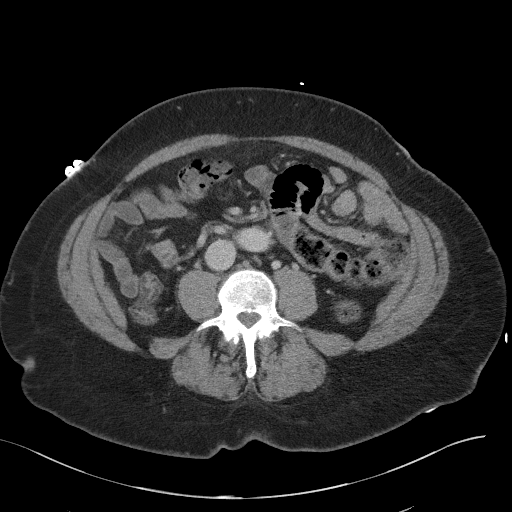
[im 57/91  soft-tissue]
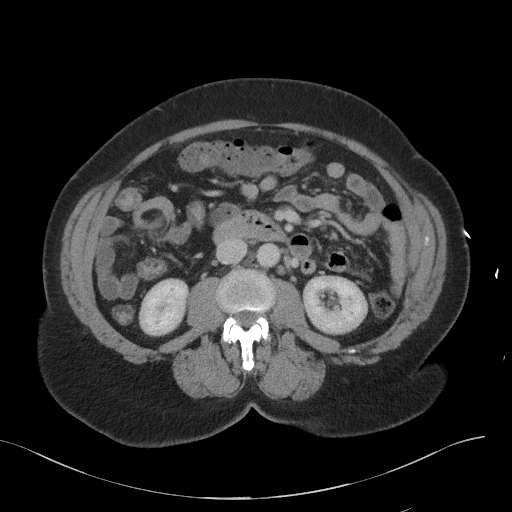
[im 62/91  soft-tissue]
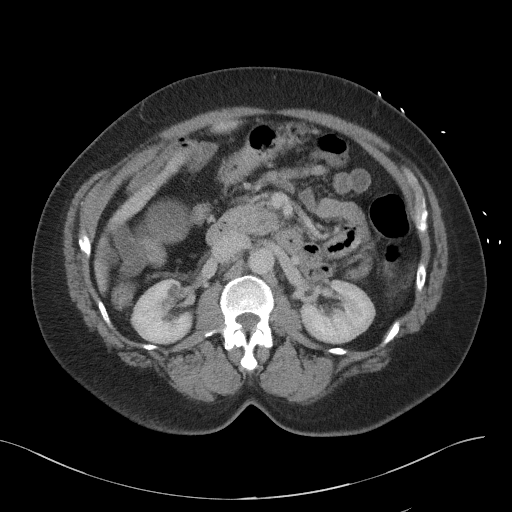
[im 62/91  bone]
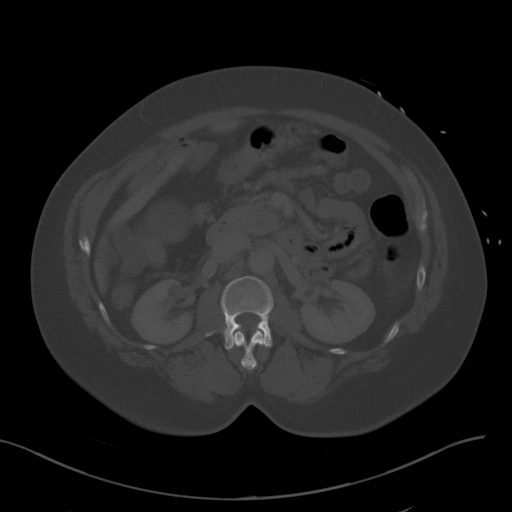
[im 72/91  soft-tissue]
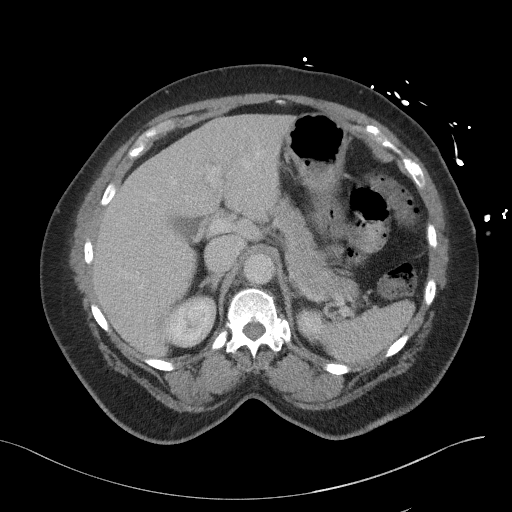
[im 76/91  soft-tissue]
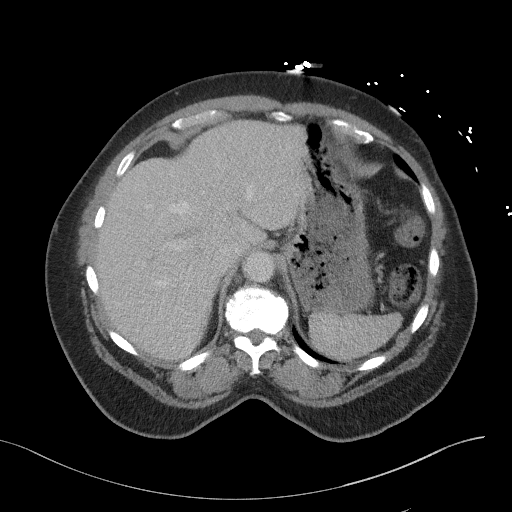
[im 86/91  soft-tissue]
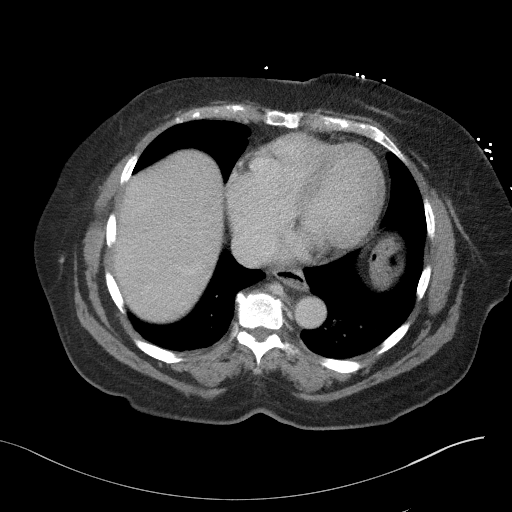

[Series 7: coronal st · coronal · 0.70mm/px · 3 of 101 slices shown]
[im 34/101  soft-tissue]
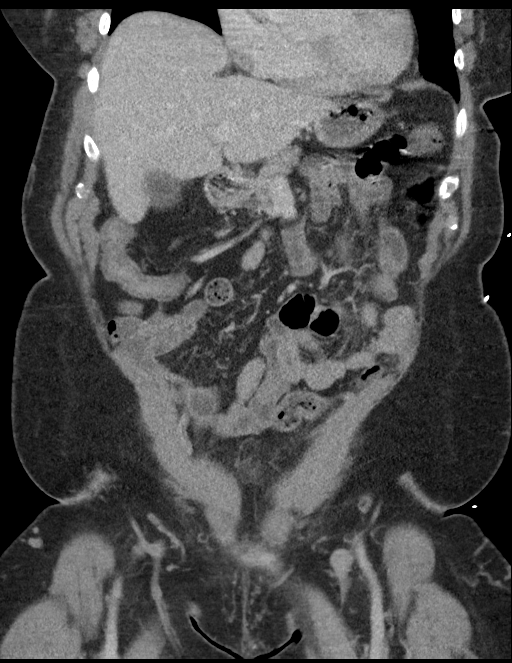
[im 45/101  soft-tissue]
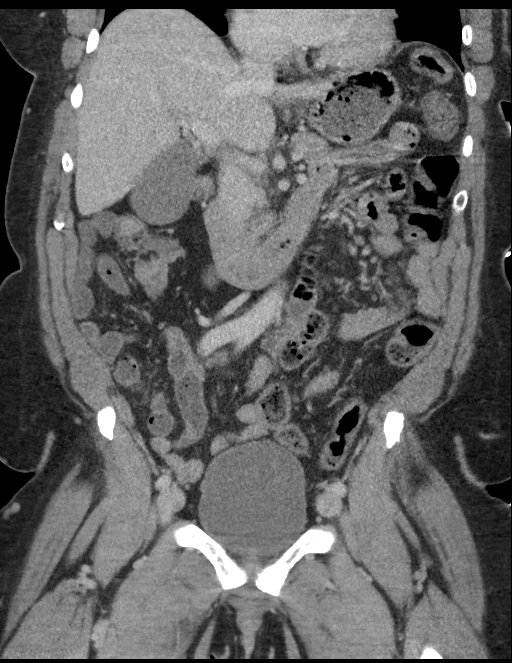
[im 56/101  soft-tissue]
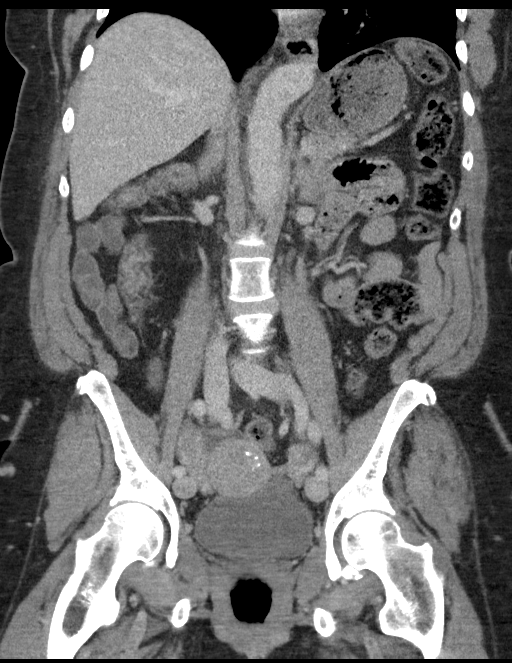

[15 of 46 positions shown; findings below may reference images not displayed]

FINDINGS: CTA CHEST FINDINGS

Cardiovascular: Examination is technically limited by motion
artifact. There is moderately good opacification of the central and
segmental pulmonary arteries. No focal filling defects. No evidence
of significant central pulmonary embolus. Normal caliber thoracic
aorta. No aortic dissection. Great vessel origins are patent. Normal
heart size. No pericardial effusions.

Mediastinum/Nodes: No significant lymphadenopathy in the chest.
Esophagus is decompressed.

Lungs/Pleura: Motion artifact limits examination. Dependent changes
in the lung bases. No airspace disease or consolidation is
appreciated. No pleural effusions. No pneumothorax. Airways are
patent.

Musculoskeletal: Degenerative changes in the spine. No destructive
bone lesions.

Review of the MIP images confirms the above findings.

CT ABDOMEN and PELVIS FINDINGS

Hepatobiliary: No focal liver abnormality is seen. No gallstones,
gallbladder wall thickening, or biliary dilatation.

Pancreas: Unremarkable. No pancreatic ductal dilatation or
surrounding inflammatory changes.

Spleen: Normal in size without focal abnormality.

Adrenals/Urinary Tract: Adrenal glands are unremarkable. Kidneys are
normal, without renal calculi, focal lesion, or hydronephrosis.
Bladder is unremarkable.

Stomach/Bowel: Stomach, small bowel, and colon are mostly
decompressed. No wall thickening or inflammatory changes are
appreciated although under distention limits evaluation of bowel
wall. Appendix is normal.

Vascular/Lymphatic: No significant vascular findings are present. No
enlarged abdominal or pelvic lymph nodes.

Reproductive: Nodular enlargement of the uterus with scattered
calcifications consistent with uterine fibroids. No abnormal adnexal
masses.

Other: No free air or free fluid in the abdomen. Abdominal wall
musculature appears intact. Minimal umbilical hernia containing fat.

Musculoskeletal: Degenerative changes in the spine. Spondylolysis
with mild spondylolisthesis at L4-5. No destructive bone lesions.

Review of the MIP images confirms the above findings.
IMPRESSION: 1. No evidence of significant pulmonary embolus.
2. No evidence of active pulmonary disease.
3. No acute process demonstrated in the abdomen or pelvis.
4. Uterine fibroids.
5. Spondylolysis with mild spondylolisthesis at L4-5.

## 2021-01-12 DIAGNOSIS — M722 Plantar fascial fibromatosis: Secondary | ICD-10-CM | POA: Insufficient documentation

## 2022-04-15 DIAGNOSIS — M5416 Radiculopathy, lumbar region: Secondary | ICD-10-CM | POA: Insufficient documentation

## 2022-04-15 DIAGNOSIS — M47816 Spondylosis without myelopathy or radiculopathy, lumbar region: Secondary | ICD-10-CM | POA: Insufficient documentation

## 2022-05-01 ENCOUNTER — Emergency Department (HOSPITAL_BASED_OUTPATIENT_CLINIC_OR_DEPARTMENT_OTHER)
Admission: EM | Admit: 2022-05-01 | Discharge: 2022-05-01 | Disposition: A | Discharge status: Home / Self Care | Payer: Medicare PPO | Attending: Emergency Medicine | Admitting: Emergency Medicine

## 2022-05-01 ENCOUNTER — Encounter (HOSPITAL_BASED_OUTPATIENT_CLINIC_OR_DEPARTMENT_OTHER): Payer: Self-pay | Admitting: Emergency Medicine

## 2022-05-01 ENCOUNTER — Emergency Department (HOSPITAL_BASED_OUTPATIENT_CLINIC_OR_DEPARTMENT_OTHER): Payer: Medicare PPO

## 2022-05-01 ENCOUNTER — Other Ambulatory Visit: Payer: Self-pay

## 2022-05-01 DIAGNOSIS — M25562 Pain in left knee: Secondary | ICD-10-CM | POA: Diagnosis present

## 2022-05-01 DIAGNOSIS — G8929 Other chronic pain: Secondary | ICD-10-CM | POA: Diagnosis not present

## 2022-05-01 DIAGNOSIS — I10 Essential (primary) hypertension: Secondary | ICD-10-CM | POA: Insufficient documentation

## 2022-05-01 MED ORDER — NAPROXEN 375 MG PO TABS: 375.0000 mg | ORAL_TABLET | Freq: Two times a day (BID) | ORAL | 0 refills | Status: AC

## 2022-05-01 NOTE — ED Triage Notes (Signed)
Pt reports LT knee pain that started today; denies injury

## 2022-05-01 NOTE — Discharge Instructions (Addendum)
Thank you for coming to [facility] Emergency Department. You were seen for left knee pain. We did an exam, and imaging, and these showed arthritis and a mild effusion of the left knee.  It is possible that an old ligamentous injury is increasing in pain, and is also possible that arthritis is causing her pain.  Please call your orthopedic surgeon on Monday to schedule an appointment in the next 1 to 2 weeks to be evaluated, you may need further imaging such as an MRI as an outpatient.  You can take Tylenol 1000 mg every 8 hours as well as naproxen 375 mg by mouth twice per day for 1 week. Please rest, ice, and elevate the knee.   Do not hesitate to return to the ED or call 911 if you experience: -Worsening symptoms -Lightheadedness, passing out -Fevers/chills -Anything else that concerns you

## 2022-05-01 NOTE — ED Provider Notes (Signed)
[facility] [place] EMERGENCY DEPARTMENT Provider Note   CSN: [id number] Arrival date & time: 05/01/22  1840     History  Chief Complaint  Patient presents with   Knee Pain    Stephanie Stephenson is a 71 y.o. female with hypertension, obesity, lumbar back pain with radiculopathy, varicose veins, who presents with knee pain.   Patient reports left knee pain that has been gradual and ongoing for many years after an old injury where she stepped on something on the floor and twisted the knee.  She was originally evaluated orthopedic surgery here in [facility] and was given a course of medication that she cannot remove the name of, her pain decreased and she has been nursing the knee for several years.  For the last few days however for some reason, the knee began hurting more than usual and of that it has been difficult to sleep and it throbs in her sleep.  Today it was hurting enough that she decided to come the emergency department.  She has not had no new inciting trauma, no redness to the knee.  She notes possibly some swelling to the inferomedial portion of the knee, but not otherwise the joint.  She has never had surgery on the knee.  Denies any numbness tingling that is new for her.  She does take gabapentin for pain.   Knee Pain      Home Medications Prior to Admission medications   Medication Sig Start Date End Date Taking? Authorizing Provider  naproxen (NAPROSYN) 375 MG tablet Take 1 tablet (375 mg total) by mouth 2 (two) times daily for 7 days. 05/01/22 05/08/22 Yes Audley Hose, MD  amLODipine (NORVASC) 5 MG tablet Take 5 mg by mouth daily.    [provider]  gabapentin (NEURONTIN) 300 MG capsule Take 300 mg by mouth 3 (three) times daily.    [provider]  ondansetron (ZOFRAN ODT) 4 MG disintegrating tablet Take 1 tablet (4 mg total) by mouth every 8 (eight) hours as needed for nausea or vomiting. 04/03/18   Little, Wenda Overland, MD   valsartan-hydrochlorothiazide (DIOVAN-HCT) 320-12.5 MG per tablet Take 1 tablet by mouth daily.    [provider]      Allergies    Patient has no known allergies.    Review of Systems   Review of Systems Review of systems Negative for f/c.  A 10 point review of systems was performed and is negative unless otherwise reported in HPI.  Physical Exam Updated Vital Signs BP (!) 158/78   Pulse (!) 58   Temp 98.2 F (36.8 C) (Oral)   Resp 20   Ht 5\' 3"  (1.6 m)   Wt 106.6 kg   SpO2 100%   BMI 41.63 kg/m  Physical Exam General: Normal appearing obese female, lying in bed.  HEENT: Sclera anicteric, MMM, trachea midline.  Cardiology: RRR, no murmurs/rubs/gallops. BL radial and DP pulses equal bilaterally.  Resp: Normal respiratory rate and effort. CTAB, no wheezes, rhonchi, crackles.  Abd: Soft, non-tender, non-distended. No rebound tenderness or guarding.  GU: Deferred. MSK: Mild effusion of L knee though body habitus makes evaluation difficult. TTP of medial joint line. No pain w/ passive ROM of knee. +Pain with active ROM though range is full. Compartments soft. Intact DP/PT pulses bilaterally. No erythema of the L knee. No deformities. No peripheral edema or signs of trauma.  Skin: warm, dry.  Neuro: A&Ox4, CNs II-XII grossly intact. MAEs. Sensation grossly intact.  Psych: Normal  mood and affect.   ED Results / Procedures / Treatments   Labs (all labs ordered are listed, but only abnormal results are displayed) Labs Reviewed - No data to display  EKG None  Radiology DG Knee Complete 4 Views Left  Result Date: 05/01/2022 CLINICAL DATA:  Acute onset knee pain EXAM: LEFT KNEE - COMPLETE 4+ VIEW COMPARISON:  None Available. FINDINGS: No malalignment. Moderate knee effusion. Mild tricompartment arthritis. Tibial spur versus calcified loose body at the anterior joint. IMPRESSION: Mild tricompartment arthritis with moderate knee effusion. Tibial spur versus calcified  loose body at the anterior joint. Electronically Signed   By: Donavan Foil M.D.   On: 05/01/2022 19:36    Procedures Procedures    Medications Ordered in ED Medications - No data to display  ED Course/ Medical Decision Making/ A&P                          Medical Decision Making Amount and/or Complexity of Data Reviewed Radiology: ordered. Decision-making details documented in ED Course.    MDM:    Presents w/ knee pain that started many years ago, acute on chronic, with no new trauma. Very low c/f septic arthritis, no redness/swelling or pain with passive ROM of the knee. Compartments are soft, nontender. No calf pain or tenderness, swelling, or erythema to suggest DVT.  Does have mild knee effusion on XR and arthritis but no fracture. Tibial spur in anterior joint. Patient w/ h/o osteoarthritis and obesity, possibly the cause of her pain. Also consider a ligamentous injury or meniscal injury remotely that has worsened with time. No instability on anterior/posterior drawer test, low c/f ACL/PCL injury.  Patient is advised to f/u with her orthopedist here in [place]. She is instructed to take Tylenol will be given a short course of naproxen 375 mg BID.  Instructed to rest, ice, and elevate the extremity.   Clinical Course as of 05/01/22 2155  Sat May 01, 2022  2021 DG Knee Complete 4 Views Left FINDINGS: No malalignment. Moderate knee effusion. Mild tricompartment arthritis. Tibial spur versus calcified loose body at the anterior joint.  IMPRESSION: Mild tricompartment arthritis with moderate knee effusion. Tibial spur versus calcified loose body at the anterior joint.   [HN]  2021 Temp: 98.2 F (36.8 C) [HN]  2021 Pulse Rate: 75 [HN]    Clinical Course User Index [HN] Audley Hose, MD    Imaging Studies ordered: I ordered imaging studies including L knee XR I independently visualized and interpreted imaging. I agree with the radiologist  interpretation  Additional history obtained from chart review.   Social Determinants of Health: Patient lives independently   Disposition:  DC w/ discharge instructions, ortho f/u, return precautions, and naproxen rx x7 days  Co morbidities that complicate the patient evaluation  Past Medical History:  Diagnosis Date   Back pain    Hypertension    Varicose veins      Medicines Meds ordered this encounter  Medications   naproxen (NAPROSYN) 375 MG tablet    Sig: Take 1 tablet (375 mg total) by mouth 2 (two) times daily for 7 days.    Dispense:  14 tablet    Refill:  0    I have reviewed the patients home medicines and have made adjustments as needed  Problem List / ED Course: Problem List Items Addressed This Visit   None Visit Diagnoses     Chronic pain of left knee    -  Primary   Relevant Medications   naproxen (NAPROSYN) 375 MG tablet                  This note was created using dictation software, which may contain spelling or grammatical errors.    Loetta Rough, MD 05/01/22 2155

## 2022-07-22 DIAGNOSIS — Z7409 Other reduced mobility: Secondary | ICD-10-CM | POA: Insufficient documentation

## 2022-07-22 DIAGNOSIS — M6281 Muscle weakness (generalized): Secondary | ICD-10-CM | POA: Insufficient documentation

## 2022-07-26 DIAGNOSIS — Z7409 Other reduced mobility: Secondary | ICD-10-CM | POA: Insufficient documentation

## 2022-07-29 DIAGNOSIS — M48061 Spinal stenosis, lumbar region without neurogenic claudication: Secondary | ICD-10-CM | POA: Insufficient documentation

## 2023-01-16 DIAGNOSIS — E785 Hyperlipidemia, unspecified: Secondary | ICD-10-CM | POA: Insufficient documentation

## 2023-03-17 DIAGNOSIS — R293 Abnormal posture: Secondary | ICD-10-CM | POA: Insufficient documentation

## 2023-03-17 DIAGNOSIS — M6289 Other specified disorders of muscle: Secondary | ICD-10-CM | POA: Insufficient documentation

## 2023-03-17 DIAGNOSIS — G8929 Other chronic pain: Secondary | ICD-10-CM | POA: Insufficient documentation

## 2023-03-17 DIAGNOSIS — M25551 Pain in right hip: Secondary | ICD-10-CM | POA: Insufficient documentation

## 2023-11-29 ENCOUNTER — Ambulatory Visit: Admitting: Family Medicine

## 2023-12-22 LAB — HM DEXA SCAN: HM Dexa Scan: NORMAL

## 2024-01-26 ENCOUNTER — Other Ambulatory Visit: Payer: Self-pay | Admitting: Family Medicine

## 2024-01-26 NOTE — Telephone Encounter (Signed)
 Prescription Request  01/26/2024  LOV: Visit date not found  What is the name of the medication or equipment?   amLODipine  (NORVASC ) 5 MG tablet  **90 day script request**  Have you contacted your pharmacy to request a refill? Yes   Which pharmacy would you like this sent to?  [facility] - [place], [place] - [address] [place] [address] Phone: [phone] Fax: [phone]    Patient notified that their request is being sent to the clinical staff for review and that they should receive a response within 2 business days.   Please advise pharmacist.

## 2024-01-27 MED ORDER — AMLODIPINE BESYLATE 5 MG PO TABS: 5.0000 mg | ORAL_TABLET | Freq: Every day | ORAL | 1 refills | Status: AC

## 2024-01-27 NOTE — Telephone Encounter (Signed)
 Requested medications are due for refill today.  Unsure - pt is requesting refill  Requested medications are on the active medications list.  yes  Last refill. 20/15  Future visit scheduled.   yes  Notes to clinic.  Medication is historical.     Requested Prescriptions  Pending Prescriptions Disp Refills   amLODipine  (NORVASC ) 5 MG tablet      Sig: Take 1 tablet (5 mg total) by mouth daily.     Cardiovascular: Calcium Channel Blockers 2 Failed - 01/27/2024  9:53 AM      Failed - Last BP in normal range    BP Readings from Last 1 Encounters:  05/01/22 (!) 158/78         Failed - Valid encounter within last 6 months    Recent Outpatient Visits   None            Passed - Last Heart Rate in normal range    Pulse Readings from Last 1 Encounters:  05/01/22 (!) 58

## 2024-02-01 DIAGNOSIS — R6889 Other general symptoms and signs: Secondary | ICD-10-CM | POA: Insufficient documentation

## 2024-02-01 DIAGNOSIS — R269 Unspecified abnormalities of gait and mobility: Secondary | ICD-10-CM | POA: Insufficient documentation

## 2024-02-22 LAB — HM MAMMOGRAPHY: HM Mammogram: NORMAL (ref 0–4)

## 2024-03-27 ENCOUNTER — Encounter: Payer: Self-pay | Admitting: Family Medicine

## 2024-03-27 ENCOUNTER — Ambulatory Visit: Admitting: Family Medicine

## 2024-03-27 VITALS — BP 110/80 | HR 80 | Temp 98.4°F | Ht 63.0 in | Wt 230.2 lb

## 2024-03-27 DIAGNOSIS — E785 Hyperlipidemia, unspecified: Secondary | ICD-10-CM | POA: Diagnosis not present

## 2024-03-27 DIAGNOSIS — Z9889 Other specified postprocedural states: Secondary | ICD-10-CM

## 2024-03-27 DIAGNOSIS — R293 Abnormal posture: Secondary | ICD-10-CM | POA: Diagnosis not present

## 2024-03-27 DIAGNOSIS — Z7689 Persons encountering health services in other specified circumstances: Secondary | ICD-10-CM

## 2024-03-27 DIAGNOSIS — Z23 Encounter for immunization: Secondary | ICD-10-CM | POA: Diagnosis not present

## 2024-03-27 DIAGNOSIS — I10 Essential (primary) hypertension: Secondary | ICD-10-CM

## 2024-03-27 NOTE — Progress Notes (Signed)
 Patient Office Visit  Assessment & Plan:  Encounter to establish care -     CBC with Differential/Platelet -     Comprehensive metabolic panel with GFR  Immunization due -     Flu vaccine HIGH DOSE PF(Fluzone Trivalent)  Hypertension, unspecified type -     CBC with Differential/Platelet -     Comprehensive metabolic panel with GFR  Hyperlipidemia, unspecified hyperlipidemia type -     Lipid panel  Abnormal posture -     Hemoglobin A1c  History of lumbar surgery  Need for shingles vaccine -     Varicella-zoster vaccine IM   Assessment and Plan    Immunizations Due for shingles vaccination. Discussed benefits and Medicare coverage. She agreed to receive it today. - Schedule second dose within 2-6 months.  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 Residual neuropathy and balance issues persist. Recovery good with physical therapy. No current back pain. Surgical scar healing well. - Continue physical therapy exercises for neuropathy and balance. - Follow up with surgeon in December.  Essential hypertension Blood pressure well-controlled, likely due to reduced pain levels post-surgery. No current need for medication refills. - Continue current antihypertensive regimen.  Hyperlipidemia Cholesterol levels slightly elevated in September. Recent tests showed no diabetes or prediabetes. - Ordered blood work to check cholesterol levels.  Obesity in weight management program Lost 10 pounds, now in maintenance phase. Previously attended sessions with a nutritionist, psychologist, and trainer, now only sees the doctor for maintenance. - Continue weight management program. - Encouraged regular exercise, including water aerobics.  General Health Maintenance Up to date on colonoscopy as of 2019. Due for tetanus vaccine next year. - Administered flu shot today. - Schedule tetanus vaccine for next  year.     Test results were reviewed and analyzed as part of the medical decision making of this visit.  Devious notes from [facility], orthopedic notes from [facility] and weight management during the office visit.  Reviewed previous labs that were done.  Follow-up on lab work notify patient.  High-dose flu shot given today, #1 Shingrix vaccine given today.  Congratulated her on the weight loss. Recommend healthy diet i.e mediterranean/DASH diet, consistent exercise - 30 minutes 5 day per week, and gradual weight loss.  Return in about 4 months (around 07/25/2024), or if symptoms worsen or fail to improve, for hypertension.   Subjective:    Patient ID: Stephanie Stephenson, female    DOB: [birth year]  Age: 73 y.o. MRN: [id number]  Chief Complaint  Patient presents with   Medical Management of Chronic Issues   Establish Care    HPI Discussed the use of AI scribe software for clinical note transcription with the patient, who gave verbal consent to proceed.  History of Present Illness        History of Present Illness Stephanie Stephenson is a 73 year old female who presents for a follow-up visit after back surgery and follow up on chronic medical issues. Patient is here to establish primary care in our office.   She underwent back surgery on January 31, 2024 at [facility], and reports her back is 'one hundred percent better' with no pain. However, she experiences numbness in the bottom of her feet and balance issues. She is focusing on exercises to regain nerve function and improve balance. Post-surgery, she had a physical therapist visit her home eight times, and she was able to climb stairs shortly after returning home. Patient  plans on going back to water aerobics next year.   Her pain level has significantly reduced, and she is now able to perform daily activities such as showering and driving. She was discharged from the hospital on February 01, 2024, after an overnight stay. She feels her  recovery has been quick and successful.  Hypertension- She is currently managing her blood pressure with valsartan, which she receives in a 90-day supply through [facility]. She reports no issues with refills and states her blood pressure is well-controlled, likely due to the reduction in pain post-surgery.  She is participating in a weight management program through [facility], which is part of [facility]. She has been involved in this program for over a year, initially seeing a nutritionist, psychologist, and trainer. Currently, she is in the maintenance phase and only sees Dr. Myra as needed. She has maintained a weight loss of ten pounds and plans to start water aerobics in the new year once her surgical incision is fully healed.  She is up to date on her colonoscopy as of 2019 and is receiving a flu shot today. She has not yet received the shingles vaccine but plans to do so. She is due for a tetanus booster next year. Her recent blood work showed slightly elevated cholesterol and glucose levels, and she was mildly anemic post-surgery. Her B12 and vitamin D levels are within normal range, and she is currently taking vitamin D supplements.  Results LABS Glucose: Elevated (01/2024) Hemoglobin: Decreased (01/2024) Vitamin B12: Elevated (01/2024) Vitamin D: Elevated (01/2024)  DIAGNOSTIC Colonoscopy: Completed (2019)  Assessment and Plan Immunizations Due for shingles vaccination. Discussed benefits and Medicare coverage. She agreed to receive it today. - Schedule second dose within 2-6 months.  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 Residual neuropathy and balance issues persist. Recovery good with physical therapy. No current back pain. Surgical scar healing well. - Continue physical therapy exercises for neuropathy and balance. Pt will start water aerobics next year - Follow up with surgeon in December.  Essential  hypertension Blood pressure well-controlled, likely due to reduced pain levels post-surgery. No current need for medication refills. - Continue current antihypertensive regimen.  Hyperlipidemia Cholesterol levels slightly elevated in September. Recent tests showed no diabetes or prediabetes. Patient is not taking cholesterol medication at this time - Ordered blood work to check cholesterol levels.  Obesity in weight management program Lost 10 pounds, now in maintenance phase. Previously attended sessions with a nutritionist, psychologist, and trainer, now only sees the doctor for maintenance. - Continue weight management program. - Encouraged regular exercise, including water aerobics.  General Health Maintenance Up to date on colonoscopy as of 2019. Due for tetanus vaccine next year. - Administered flu shot today. - Schedule tetanus vaccine for next year.  The 10-year ASCVD risk score (Arnett DK, et al., 2019) is: 11.8%   Values used to calculate the score:     Age: 12 years     Clincally relevant sex: Female     Is Non-Hispanic African American: Yes     Diabetic: No     Tobacco smoker: No     Systolic Blood Pressure: 110 mmHg     Is BP treated: Yes     HDL Cholesterol: 73 mg/dL     Total Cholesterol: 200 mg/dL   The 89-bzjm ASCVD risk score (Arnett DK, et al., 2019) is: 11.9%  Past Medical History:  Diagnosis Date   Arthritis 2020  Back pain    Hypertension 2000   Varicose veins    Past Surgical History:  Procedure Laterality Date   BREAST REDUCTION SURGERY  08/30/2012   BREAST SURGERY  2014   ENDOVENOUS ABLATION SAPHENOUS VEIN W/ LASER Right 08/29/2014   endovenous laser ablation right anterior branch of GSV, sclerotherapy (right leg), stab phlebectomy 10-20 incisions (right leg) by Krystal Doing MD   ENDOVENOUS ABLATION SAPHENOUS VEIN W/ LASER Right 10/03/2014   endovenous laser ablation right greater saphenous vein by Krystal Doing MD   ENDOVENOUS ABLATION SAPHENOUS  VEIN W/ LASER Left 01/02/2015   endovenous laser ablation left greater saphenous and stab phlebectomy 10-20 incisions left leg    SPINE SURGERY  01/31/2024   [facility]   Social History   Tobacco Use   Smoking status: Never   Smokeless tobacco: Never  Vaping Use   Vaping status: Never Used  Substance Use Topics   Alcohol use: No   Drug use: No   Family History  Problem Relation Age of Onset   Hypertension Mother    Varicose Veins Mother    Arthritis Mother    Hypertension Father    Diabetes Paternal Grandmother    No Known Allergies  ROS    Objective:    BP 110/80   Pulse 80   Temp 98.4 F (36.9 C)   Ht 5' 3 (1.6 m)   Wt 230 lb 4 oz (104.4 kg)   SpO2 99%   BMI 40.79 kg/m  BP Readings from Last 3 Encounters:  03/27/24 110/80  05/01/22 (!) 158/78  04/03/18 (!) 165/65   Wt Readings from Last 3 Encounters:  03/27/24 230 lb 4 oz (104.4 kg)  05/01/22 235 lb (106.6 kg)  04/02/18 214 lb 4.6 oz (97.2 kg)    Physical Exam Vitals and nursing note reviewed.  Constitutional:      General: She is not in acute distress.    Appearance: Normal appearance.  HENT:     Head: Normocephalic.     Right Ear: Tympanic membrane, ear canal and external ear normal.     Left Ear: Tympanic membrane, ear canal and external ear normal.  Eyes:     Extraocular Movements: Extraocular movements intact.     Conjunctiva/sclera: Conjunctivae normal.     Pupils: Pupils are equal, round, and reactive to light.  Cardiovascular:     Rate and Rhythm: Normal rate and regular rhythm.     Heart sounds: Normal heart sounds.  Pulmonary:     Effort: Pulmonary effort is normal.     Breath sounds: Normal breath sounds. No wheezing.  Musculoskeletal:     Right lower leg: No edema.     Left lower leg: No edema.     Comments: Lower back- Surgical scar healing well.  Neurological:     General: No focal deficit present.     Mental Status: She is alert and oriented to person, place, and  time.     Coordination: Coordination is intact.     Gait: Gait normal.  Psychiatric:        Mood and Affect: Mood normal.        Behavior: Behavior normal.        Thought Content: Thought content normal.        Judgment: Judgment normal.      Results for orders placed or performed in visit on 03/27/24  HM MAMMOGRAPHY  Result Value Ref Range   HM Mammogram Self Reported Normal 0-4 Bi-Rad,  Self Reported Normal  HM COLONOSCOPY  Result Value Ref Range   HM Colonoscopy See Report (in chart) See Report (in chart), Patient Reported

## 2024-03-28 ENCOUNTER — Other Ambulatory Visit: Payer: Self-pay

## 2024-03-28 ENCOUNTER — Ambulatory Visit: Payer: Self-pay | Admitting: Family Medicine

## 2024-03-28 LAB — HEMOGLOBIN A1C
Hgb A1c MFr Bld: 5.6 % (ref <5.7)
Mean Plasma Glucose: 114 mg/dL
eAG (mmol/L): 6.3 mmol/L

## 2024-03-28 LAB — COMPREHENSIVE METABOLIC PANEL WITH GFR
AG Ratio: 1.5 (calc) (ref 1.0–2.5)
ALT: 12 U/L (ref 6–29)
AST: 18 U/L (ref 10–35)
Albumin: 4.2 g/dL (ref 3.6–5.1)
Alkaline phosphatase (APISO): 93 U/L (ref 37–153)
BUN: 19 mg/dL (ref 7–25)
CO2: 29 mmol/L (ref 20–32)
Calcium: 9.7 mg/dL (ref 8.6–10.4)
Chloride: 103 mmol/L (ref 98–110)
Creat: 0.81 mg/dL (ref 0.60–1.00)
Globulin: 2.8 g/dL (ref 1.9–3.7)
Glucose, Bld: 86 mg/dL (ref 65–99)
Potassium: 4 mmol/L (ref 3.5–5.3)
Sodium: 140 mmol/L (ref 135–146)
Total Bilirubin: 0.4 mg/dL (ref 0.2–1.2)
Total Protein: 7 g/dL (ref 6.1–8.1)
eGFR: 77 mL/min/1.73m2 (ref >=60)

## 2024-03-28 LAB — CBC WITH DIFFERENTIAL/PLATELET
Absolute Lymphocytes: 1674 {cells}/uL (ref 850–3900)
Absolute Monocytes: 301 {cells}/uL (ref 200–950)
Basophils Absolute: 50 {cells}/uL (ref 0–200)
Basophils Relative: 1.6 %
Eosinophils Absolute: 59 {cells}/uL (ref 15–500)
Eosinophils Relative: 1.9 %
HCT: 41.3 % (ref 35.0–45.0)
Hemoglobin: 13 g/dL (ref 11.7–15.5)
MCH: 27.5 pg (ref 27.0–33.0)
MCHC: 31.5 g/dL — ABNORMAL LOW (ref 32.0–36.0)
MCV: 87.3 fL (ref 80.0–100.0)
MPV: 9.7 fL (ref 7.5–12.5)
Monocytes Relative: 9.7 %
Neutro Abs: 1017 {cells}/uL — ABNORMAL LOW (ref 1500–7800)
Neutrophils Relative %: 32.8 %
Platelets: 332 Thousand/uL (ref 140–400)
RBC: 4.73 Million/uL (ref 3.80–5.10)
RDW: 12.5 % (ref 11.0–15.0)
Total Lymphocyte: 54 %
WBC: 3.1 Thousand/uL — ABNORMAL LOW (ref 3.8–10.8)

## 2024-03-28 LAB — LIPID PANEL
Cholesterol: 200 mg/dL — ABNORMAL HIGH (ref <200)
HDL: 73 mg/dL (ref >=50)
LDL Cholesterol (Calc): 108 mg/dL — ABNORMAL HIGH
Non-HDL Cholesterol (Calc): 127 mg/dL (ref <130)
Total CHOL/HDL Ratio: 2.7 (calc) (ref <5.0)
Triglycerides: 94 mg/dL (ref <150)

## 2024-03-28 MED ORDER — ATORVASTATIN CALCIUM 10 MG PO TABS: 10.0000 mg | ORAL_TABLET | Freq: Every day | ORAL | 3 refills | Status: AC

## 2024-05-22 ENCOUNTER — Telehealth: Payer: Self-pay

## 2024-05-22 NOTE — Telephone Encounter (Signed)
 Are you okay with this? Thank you!  Copied from CRM #[id number]. Topic: Clinical - Medication Question >> May 22, 2024  4:13 PM Selinda RAMAN wrote: Reason for CRM: The patient called in stating her provider wrote her prescription previously at her old location for Gabapentin  600mg  3 times a day. She says she hasn't written it for her in a while because she last got it when she was in the hospital. She says she needs a new prescription for the 600mg  to be sent to her mail order pharmacy if possible. She uses  [facility] - [place], [place] - [address]  Phone: [phone] Fax: [phone]  If there are any questions or problems please contact the patient right away so she knows. She is not scheduled to see her provider again until March 11th

## 2024-05-23 ENCOUNTER — Other Ambulatory Visit: Payer: Self-pay

## 2024-05-23 MED ORDER — GABAPENTIN 600 MG PO TABS: 600.0000 mg | ORAL_TABLET | Freq: Three times a day (TID) | ORAL | 1 refills | Status: AC

## 2024-06-14 ENCOUNTER — Ambulatory Visit (INDEPENDENT_AMBULATORY_CARE_PROVIDER_SITE_OTHER)

## 2024-06-14 VITALS — Ht 63.0 in | Wt 230.0 lb

## 2024-06-14 DIAGNOSIS — Z0000 Encounter for general adult medical examination without abnormal findings: Secondary | ICD-10-CM | POA: Diagnosis not present

## 2024-06-14 NOTE — Patient Instructions (Signed)
 [name],  Thank you for taking the time for your Medicare Wellness Visit. I appreciate your continued commitment to your health goals. Please review the care plan we discussed, and feel free to reach out if I can assist you further.  Please note that Annual Wellness Visits do not include a physical exam. Some assessments may be limited, especially if the visit was conducted virtually. If needed, we may recommend an in-person follow-up with your provider.  Ongoing Care Seeing your primary care provider every 3 to 6 months helps us  monitor your health and provide consistent, personalized care.   Referrals If a referral was made during today's visit and you haven't received any updates within two weeks, please contact the referred provider directly to check on the status.  Recommended Screenings:  Health Maintenance  Topic Date Due   Hepatitis C Screening  Never done   COVID-19 Vaccine (3 - 2025-26 season) 01/16/2024   Zoster (Shingles) Vaccine (2 of 2) 05/22/2024   DTaP/Tdap/Td vaccine (3 - Td or Tdap) 01/23/2025   Breast Cancer Screening  02/21/2025   Medicare Annual Wellness Visit  06/14/2025   Colon Cancer Screening  02/15/2028   Osteoporosis screening with Bone Density Scan  12/21/2028   Pneumococcal Vaccine for age over 36  Completed   Flu Shot  Completed   Meningitis B Vaccine  Aged Out       06/14/2024   10:07 AM  Advanced Directives  Does Patient Have a Medical Advance Directive? No  Would patient like information on creating a medical advance directive? Yes (MAU/Ambulatory/Procedural Areas - Information given)   Information on Advanced Care Planning can be found at [place]  Secretary of [facility] Advance Health Care Directives Advance Health Care Directives ([email or web address]   Vision: Annual vision screenings are recommended for early detection of glaucoma, cataracts, and diabetic retinopathy. These exams can also reveal signs of chronic conditions such as diabetes and high  blood pressure.  Dental: Annual dental screenings help detect early signs of oral cancer, gum disease, and other conditions linked to overall health, including heart disease and diabetes.  Please see the attached documents for additional preventive care recommendations.

## 2024-06-14 NOTE — Progress Notes (Signed)
 "  Chief Complaint  Patient presents with   Medicare Wellness     Subjective:   Stephanie Stephenson is a 74 y.o. female who presents for a Medicare Annual Wellness Visit.  Visit info / Clinical Intake: Medicare Wellness Visit Type:: Subsequent Annual Wellness Visit Persons participating in visit and providing information:: patient Medicare Wellness Visit Mode:: Telephone If telephone:: video declined Since this visit was completed virtually, some vitals may be partially provided or unavailable. Missing vitals are due to the limitations of the virtual format.: Documented vitals are patient reported If Telephone or Video please confirm:: I connected with patient using audio/video enable telemedicine. I verified patient identity with two identifiers, discussed telehealth limitations, and patient agreed to proceed. Patient Location:: home Provider Location:: home office Interpreter Needed?: No Pre-visit prep was completed: yes AWV questionnaire completed by patient prior to visit?: no Living arrangements:: lives with spouse/significant other Patient's Overall Health Status Rating: good Typical amount of pain: some Does pain affect daily life?: no Are you currently prescribed opioids?: no  Dietary Habits and Nutritional Risks How many meals a day?: 3 Eats fruit and vegetables daily?: yes Most meals are obtained by: preparing own meals In the last 2 weeks, have you had any of the following?: none Diabetic:: no  Functional Status Activities of Daily Living (to include ambulation/medication): Independent Ambulation: Independent with device- listed below Home Assistive Devices/Equipment: Cane Medication Administration: Independent Home Management (perform basic housework or laundry): Independent Manage your own finances?: yes Primary transportation is: driving Concerns about vision?: no *vision screening is required for WTM* Concerns about hearing?: no  Fall Screening Falls in the past  year?: 0 Number of falls in past year: 0 Was there an injury with Fall?: 0 Fall Risk Category Calculator: 0 Patient Fall Risk Level: Low Fall Risk  Fall Risk Patient at Risk for Falls Due to: Impaired mobility Fall risk Follow up: Falls prevention discussed; Education provided; Falls evaluation completed  Home and Transportation Safety: All rugs have non-skid backing?: yes All stairs or steps have railings?: yes Grab bars in the bathtub or shower?: yes Have non-skid surface in bathtub or shower?: yes Good home lighting?: yes Regular seat belt use?: yes Hospital stays in the last year:: (!) yes How many hospital stays:: 1 Reason: Surgery  Cognitive Assessment Difficulty concentrating, remembering, or making decisions? : no Will 6CIT or Mini Cog be Completed: no 6CIT or Mini Cog Declined: patient alert, oriented, able to answer questions appropriately and recall recent events  Advance Directives (For Healthcare) Does Patient Have a Medical Advance Directive?: No Would patient like information on creating a medical advance directive?: Yes (MAU/Ambulatory/Procedural Areas - Information given)  Reviewed/Updated  Reviewed/Updated: Reviewed All (Medical, Surgical, Family, Medications, Allergies, Care Teams, Patient Goals)    Allergies (verified) Patient has no known allergies.   Current Medications (verified) Outpatient Encounter Medications as of 06/14/2024  Medication Sig   amLODipine  (NORVASC ) 5 MG tablet Take 1 tablet (5 mg total) by mouth daily.   atorvastatin  (LIPITOR) 10 MG tablet Take 1 tablet (10 mg total) by mouth daily.   Ca Carbonate-Mag Oxide-Vit C (LOCALNESIUM-C) 400-116.7-166.7 MG TABS Take 2 tablets by mouth every morning.   Cholecalciferol (D 5000) 125 MCG (5000 UT) capsule Take 5,000 Units by mouth daily.   cyclobenzaprine (FLEXERIL) 5 MG tablet Take 5 mg by mouth 3 (three) times daily as needed for muscle spasms.   gabapentin  (NEURONTIN ) 600 MG tablet Take 1  tablet (600 mg total) by mouth 3 (three) times  daily.   MAGNESIUM PO Take 2 tablets by mouth every morning.   valsartan-hydrochlorothiazide (DIOVAN-HCT) 320-12.5 MG per tablet Take 1 tablet by mouth daily.   [DISCONTINUED] ondansetron  (ZOFRAN  ODT) 4 MG disintegrating tablet Take 1 tablet (4 mg total) by mouth every 8 (eight) hours as needed for nausea or vomiting.   No facility-administered encounter medications on file as of 06/14/2024.    History: Past Medical History:  Diagnosis Date   Arthritis 2020   Back pain    Hypertension 2000   Varicose veins    Past Surgical History:  Procedure Laterality Date   BREAST REDUCTION SURGERY  08/30/2012   BREAST SURGERY  2014   ENDOVENOUS ABLATION SAPHENOUS VEIN W/ LASER Right 08/29/2014   endovenous laser ablation right anterior branch of GSV, sclerotherapy (right leg), stab phlebectomy 10-20 incisions (right leg) by Krystal Doing MD   ENDOVENOUS ABLATION SAPHENOUS VEIN W/ LASER Right 10/03/2014   endovenous laser ablation right greater saphenous vein by Krystal Doing MD   ENDOVENOUS ABLATION SAPHENOUS VEIN W/ LASER Left 01/02/2015   endovenous laser ablation left greater saphenous and stab phlebectomy 10-20 incisions left leg    SPINE SURGERY  01/31/2024   [facility]   Family History  Problem Relation Age of Onset   Hypertension Mother    Varicose Veins Mother    Arthritis Mother    Hypertension Father    Diabetes Paternal Grandmother    Social History   Occupational History   Not on file  Tobacco Use   Smoking status: Never   Smokeless tobacco: Never  Vaping Use   Vaping status: Never Used  Substance and Sexual Activity   Alcohol use: Never   Drug use: Never   Sexual activity: Not Currently    Birth control/protection: None   Tobacco Counseling Counseling given: Not Answered  SDOH Screenings   Food Insecurity: No Food Insecurity (06/14/2024)  Housing: Low Risk (06/14/2024)  Transportation Needs: No Transportation  Needs (06/14/2024)  Utilities: Not At Risk (06/14/2024)  Depression (PHQ2-9): Low Risk (06/14/2024)  Financial Resource Strain: Low Risk (03/20/2024)  Physical Activity: Insufficiently Active (06/14/2024)  Social Connections: Socially Integrated (06/14/2024)  Stress: No Stress Concern Present (06/14/2024)  Tobacco Use: Low Risk (06/14/2024)  Health Literacy: Adequate Health Literacy (06/14/2024)   See flowsheets for full screening details  Depression Screen PHQ 2 & 9 Depression Scale- Over the past 2 weeks, how often have you been bothered by any of the following problems? Little interest or pleasure in doing things: 0 Feeling down, depressed, or hopeless (PHQ Adolescent also includes...irritable): 0 PHQ-2 Total Score: 0 Trouble falling or staying asleep, or sleeping too much: 0 Feeling tired or having little energy: 0 Poor appetite or overeating (PHQ Adolescent also includes...weight loss): 0 Feeling bad about yourself - or that you are a failure or have let yourself or your family down: 0 Trouble concentrating on things, such as reading the newspaper or watching television (PHQ Adolescent also includes...like school work): 0 Moving or speaking so slowly that other people could have noticed. Or the opposite - being so fidgety or restless that you have been moving around a lot more than usual: 0 Thoughts that you would be better off dead, or of hurting yourself in some way: 0 PHQ-9 Total Score: 0 If you checked off any problems, how difficult have these problems made it for you to do your work, take care of things at home, or get along with other people?: Not difficult at all  Goals Addressed             This Visit's Progress    Remain active and independent   On track            Objective:    Today's Vitals   06/14/24 1002  Weight: 230 lb (104.3 kg)  Height: 5' 3 (1.6 m)   Body mass index is 40.74 kg/m.  Hearing/Vision screen No results found. Immunizations and  Health Maintenance Health Maintenance  Topic Date Due   Hepatitis C Screening  Never done   COVID-19 Vaccine (3 - 2025-26 season) 01/16/2024   Zoster Vaccines- Shingrix  (2 of 2) 05/22/2024   DTaP/Tdap/Td (3 - Td or Tdap) 01/23/2025   Mammogram  02/21/2025   Medicare Annual Wellness (AWV)  06/14/2025   Colonoscopy  02/15/2028   Bone Density Scan  12/21/2028   Pneumococcal Vaccine: 50+ Years  Completed   Influenza Vaccine  Completed   Meningococcal B Vaccine  Aged Out        Assessment/Plan:  This is a routine wellness examination for Stephanie Stephenson.  Patient Care Team: Aletha Bene, MD as PCP - General (Family Medicine) Lonie Stephen Stank, MD as Referring Physician (Orthopedic Surgery) Glorya Clause, Gregorio Beau, MD as Referring Physician Corie Sherlean Jenkins DEVONNA (Physician Assistant)  I have personally reviewed and noted the following in the patients chart:   Medical and social history Use of alcohol, tobacco or illicit drugs  Current medications and supplements including opioid prescriptions. Functional ability and status Nutritional status Physical activity Advanced directives List of other physicians Hospitalizations, surgeries, and ER visits in previous 12 months Vitals Screenings to include cognitive, depression, and falls Referrals and appointments  Orders Placed This Encounter  Procedures   HM DEXA SCAN    This external order was created through the Results Console.   In addition, I have reviewed and discussed with patient certain preventive protocols, quality metrics, and best practice recommendations. A written personalized care plan for preventive services as well as general preventive health recommendations were provided to patient.   Lavelle Charmaine Browner, LPN   8/70/7973   Return in 1 year (on 06/14/2025).  After Visit Summary: (MyChart) Due to this being a telephonic visit, the after visit summary with patients personalized plan was offered to  patient via MyChart   Nurse Notes: No voiced or noted concerns at this time Patient advised to keep follow-up appointment with PCP (07/25/24)  "

## 2024-07-25 ENCOUNTER — Ambulatory Visit: Admitting: Family Medicine
